# Patient Record
Sex: Female | Born: 1960 | Race: White | Hispanic: No | Marital: Married | Smoking: Never smoker
Health system: Southern US, Community
[De-identification: ages and names within clinical notes are randomized; demographics above are authoritative.]

## PROBLEM LIST (undated history)

## (undated) DIAGNOSIS — M1990 Unspecified osteoarthritis, unspecified site: Secondary | ICD-10-CM

## (undated) DIAGNOSIS — K5100 Ulcerative (chronic) pancolitis without complications: Secondary | ICD-10-CM

## (undated) DIAGNOSIS — Z86010 Personal history of colonic polyps: Secondary | ICD-10-CM

## (undated) DIAGNOSIS — E119 Type 2 diabetes mellitus without complications: Secondary | ICD-10-CM

## (undated) DIAGNOSIS — R945 Abnormal results of liver function studies: Secondary | ICD-10-CM

## (undated) DIAGNOSIS — K219 Gastro-esophageal reflux disease without esophagitis: Secondary | ICD-10-CM

## (undated) DIAGNOSIS — F988 Other specified behavioral and emotional disorders with onset usually occurring in childhood and adolescence: Secondary | ICD-10-CM

## (undated) DIAGNOSIS — T7840XA Allergy, unspecified, initial encounter: Secondary | ICD-10-CM

## (undated) DIAGNOSIS — M722 Plantar fascial fibromatosis: Secondary | ICD-10-CM

## (undated) DIAGNOSIS — I10 Essential (primary) hypertension: Secondary | ICD-10-CM

## (undated) DIAGNOSIS — R51 Headache: Secondary | ICD-10-CM

## (undated) DIAGNOSIS — R7989 Other specified abnormal findings of blood chemistry: Secondary | ICD-10-CM

## (undated) DIAGNOSIS — F32A Depression, unspecified: Secondary | ICD-10-CM

## (undated) DIAGNOSIS — E669 Obesity, unspecified: Secondary | ICD-10-CM

## (undated) DIAGNOSIS — E781 Pure hyperglyceridemia: Secondary | ICD-10-CM

## (undated) DIAGNOSIS — F419 Anxiety disorder, unspecified: Secondary | ICD-10-CM

## (undated) DIAGNOSIS — R7303 Prediabetes: Secondary | ICD-10-CM

## (undated) HISTORY — DX: Prediabetes: R73.03

## (undated) HISTORY — PX: POLYPECTOMY: SHX149

## (undated) HISTORY — DX: Gastro-esophageal reflux disease without esophagitis: K21.9

## (undated) HISTORY — DX: Other specified abnormal findings of blood chemistry: R79.89

## (undated) HISTORY — DX: Allergy, unspecified, initial encounter: T78.40XA

## (undated) HISTORY — DX: Depression, unspecified: F32.A

## (undated) HISTORY — DX: Personal history of colonic polyps: Z86.010

## (undated) HISTORY — DX: Essential (primary) hypertension: I10

## (undated) HISTORY — DX: Gilbert syndrome: E80.4

## (undated) HISTORY — DX: Obesity, unspecified: E66.9

## (undated) HISTORY — DX: Plantar fascial fibromatosis: M72.2

## (undated) HISTORY — DX: Ulcerative (chronic) pancolitis without complications: K51.00

## (undated) HISTORY — DX: Type 2 diabetes mellitus without complications: E11.9

## (undated) HISTORY — DX: Abnormal results of liver function studies: R94.5

## (undated) HISTORY — DX: Headache: R51

## (undated) HISTORY — DX: Anxiety disorder, unspecified: F41.9

## (undated) HISTORY — DX: Pure hyperglyceridemia: E78.1

## (undated) HISTORY — DX: Unspecified osteoarthritis, unspecified site: M19.90

---

## 1993-05-23 HISTORY — PX: GANGLION CYST EXCISION: SHX1691

## 1999-06-30 ENCOUNTER — Other Ambulatory Visit: Admission: RE | Admit: 1999-06-30 | Discharge: 1999-06-30 | Payer: Self-pay | Admitting: Obstetrics and Gynecology

## 2003-07-22 ENCOUNTER — Encounter: Payer: Self-pay | Admitting: Internal Medicine

## 2003-08-06 ENCOUNTER — Encounter: Payer: Self-pay | Admitting: Internal Medicine

## 2003-11-11 ENCOUNTER — Other Ambulatory Visit: Admission: RE | Admit: 2003-11-11 | Discharge: 2003-11-11 | Payer: Self-pay | Admitting: Internal Medicine

## 2003-11-11 ENCOUNTER — Encounter: Admission: RE | Admit: 2003-11-11 | Discharge: 2003-11-11 | Payer: Self-pay | Admitting: Internal Medicine

## 2004-11-12 ENCOUNTER — Encounter: Admission: RE | Admit: 2004-11-12 | Discharge: 2004-12-22 | Payer: Self-pay | Admitting: Internal Medicine

## 2005-04-20 ENCOUNTER — Ambulatory Visit: Payer: Self-pay | Admitting: Internal Medicine

## 2005-06-28 ENCOUNTER — Encounter: Admission: RE | Admit: 2005-06-28 | Discharge: 2005-06-28 | Payer: Self-pay | Admitting: Internal Medicine

## 2005-06-29 ENCOUNTER — Other Ambulatory Visit: Admission: RE | Admit: 2005-06-29 | Discharge: 2005-06-29 | Payer: Self-pay | Admitting: Internal Medicine

## 2005-08-18 ENCOUNTER — Ambulatory Visit: Payer: Self-pay | Admitting: Internal Medicine

## 2006-07-26 ENCOUNTER — Other Ambulatory Visit: Admission: RE | Admit: 2006-07-26 | Discharge: 2006-07-26 | Payer: Self-pay | Admitting: Family Medicine

## 2006-07-27 ENCOUNTER — Encounter: Admission: RE | Admit: 2006-07-27 | Discharge: 2006-07-27 | Payer: Self-pay | Admitting: *Deleted

## 2006-09-29 ENCOUNTER — Ambulatory Visit: Payer: Self-pay | Admitting: Internal Medicine

## 2007-08-10 ENCOUNTER — Other Ambulatory Visit: Admission: RE | Admit: 2007-08-10 | Discharge: 2007-08-10 | Payer: Self-pay | Admitting: Family Medicine

## 2007-08-10 ENCOUNTER — Encounter: Payer: Self-pay | Admitting: Internal Medicine

## 2007-08-10 ENCOUNTER — Encounter: Admission: RE | Admit: 2007-08-10 | Discharge: 2007-08-10 | Payer: Self-pay | Admitting: Family Medicine

## 2007-09-06 ENCOUNTER — Encounter: Payer: Self-pay | Admitting: Internal Medicine

## 2007-10-23 DIAGNOSIS — T7840XA Allergy, unspecified, initial encounter: Secondary | ICD-10-CM | POA: Insufficient documentation

## 2007-10-23 DIAGNOSIS — E66812 Obesity, class 2: Secondary | ICD-10-CM | POA: Insufficient documentation

## 2007-10-23 DIAGNOSIS — R51 Headache: Secondary | ICD-10-CM | POA: Insufficient documentation

## 2007-10-23 DIAGNOSIS — R519 Headache, unspecified: Secondary | ICD-10-CM | POA: Insufficient documentation

## 2007-10-23 DIAGNOSIS — I10 Essential (primary) hypertension: Secondary | ICD-10-CM | POA: Insufficient documentation

## 2007-10-23 DIAGNOSIS — E669 Obesity, unspecified: Secondary | ICD-10-CM

## 2007-10-24 ENCOUNTER — Ambulatory Visit: Payer: Self-pay | Admitting: Internal Medicine

## 2007-10-24 DIAGNOSIS — K5100 Ulcerative (chronic) pancolitis without complications: Secondary | ICD-10-CM | POA: Insufficient documentation

## 2008-04-03 ENCOUNTER — Telehealth: Payer: Self-pay | Admitting: Internal Medicine

## 2008-09-05 ENCOUNTER — Encounter: Admission: RE | Admit: 2008-09-05 | Discharge: 2008-09-05 | Payer: Self-pay | Admitting: Family Medicine

## 2008-09-05 ENCOUNTER — Encounter: Payer: Self-pay | Admitting: Internal Medicine

## 2008-09-05 ENCOUNTER — Other Ambulatory Visit: Admission: RE | Admit: 2008-09-05 | Discharge: 2008-09-05 | Payer: Self-pay | Admitting: Family Medicine

## 2008-10-09 ENCOUNTER — Telehealth: Payer: Self-pay | Admitting: Internal Medicine

## 2008-11-18 ENCOUNTER — Ambulatory Visit: Payer: Self-pay | Admitting: Internal Medicine

## 2008-11-19 LAB — CONVERTED CEMR LAB
ALT: 41 units/L — ABNORMAL HIGH (ref 0–35)
AST: 38 units/L — ABNORMAL HIGH (ref 0–37)
Albumin: 4 g/dL (ref 3.5–5.2)
Basophils Relative: 0.4 % (ref 0.0–3.0)
Eosinophils Absolute: 0.2 10*3/uL (ref 0.0–0.7)
HCT: 40.3 % (ref 36.0–46.0)
Hemoglobin: 14.4 g/dL (ref 12.0–15.0)
Leukocytes, UA: NEGATIVE
MCV: 91.9 fL (ref 78.0–100.0)
Monocytes Absolute: 0.6 10*3/uL (ref 0.1–1.0)
Monocytes Relative: 7.9 % (ref 3.0–12.0)
Neutrophils Relative %: 59.4 % (ref 43.0–77.0)
Platelets: 267 10*3/uL (ref 150.0–400.0)
RDW: 12.1 % (ref 11.5–14.6)
Specific Gravity, Urine: >=1.03 (ref 1.000–1.030)
WBC: 8.2 10*3/uL (ref 4.5–10.5)

## 2008-12-04 ENCOUNTER — Telehealth: Payer: Self-pay | Admitting: Internal Medicine

## 2009-01-02 ENCOUNTER — Telehealth: Payer: Self-pay | Admitting: Internal Medicine

## 2009-01-08 ENCOUNTER — Encounter: Payer: Self-pay | Admitting: Internal Medicine

## 2009-01-08 ENCOUNTER — Ambulatory Visit: Payer: Self-pay | Admitting: Internal Medicine

## 2009-01-08 HISTORY — PX: COLONOSCOPY: SHX174

## 2009-01-12 ENCOUNTER — Encounter: Payer: Self-pay | Admitting: Internal Medicine

## 2009-01-19 ENCOUNTER — Telehealth: Payer: Self-pay | Admitting: Internal Medicine

## 2009-05-01 ENCOUNTER — Telehealth: Payer: Self-pay | Admitting: Internal Medicine

## 2009-09-07 ENCOUNTER — Encounter: Admission: RE | Admit: 2009-09-07 | Discharge: 2009-09-07 | Payer: Self-pay | Admitting: Family Medicine

## 2009-10-15 ENCOUNTER — Telehealth: Payer: Self-pay | Admitting: Internal Medicine

## 2010-01-07 ENCOUNTER — Telehealth: Payer: Self-pay | Admitting: Internal Medicine

## 2010-01-14 ENCOUNTER — Ambulatory Visit: Payer: Self-pay | Admitting: Internal Medicine

## 2010-05-23 HISTORY — PX: ABLATION SAPHENOUS VEIN W/ RFA: SUR11

## 2010-06-22 NOTE — Progress Notes (Signed)
Summary: Asacol refills  Phone Note Call from Patient Call back at [facility] Phone [phone]   Call For: Dr [name] Summary of Call: Will she need a follow up appoinment before more refills for Asacol are refilled. Initial call taken by: Irwin Brakeman [facility],  Oct 15, 2009 1:26 PM  Follow-up for Phone Call        I spoke to pt and she is doing well on Asacol.  I advised her she will have to be seen for follow up in August, but I will send refill through that time. Attempted to schedule appt after 2nd week in  August, but schedule is not in that far.  Flag sent to myself to call pt to schedule.  Refil sent to [facility]. Follow-up by: Abelino Derrick CMA Deborra Medina),  Oct 16, 2009 9:42 AM    Prescriptions: ASACOL 400 MG  TBEC (MESALAMINE) 6 by mouth two times a day  #1080 x 0   Entered by:   Abelino Derrick CMA ([place])   Authorized by:   Gatha Mayer MD, [facility]   Signed by:   Abelino Derrick CMA ([place]) on 10/16/2009   Method used:   Electronically to        [facility] (mail-order)             ,          Ph: [phone]       Fax: [phone]   RxID:   [id number]

## 2010-06-22 NOTE — Assessment & Plan Note (Signed)
Summary: FOLLOW UP ULCERATIVE COLITIS/SP   History of Present Illness Visit Type: Follow-up Visit Primary GI MD: Stan Head MD [facility] Primary Provider: Bill Salinas, NP Chief Complaint: ulcerative colitis History of Present Illness:   50 yo ww with chronic universal UC. She is well without signs or symptoms of problems. On chronic asacol. Has lost weight on phentermine and diet, coordinated by PCP.    GI Review of Systems      Denies abdominal pain, acid reflux, belching, bloating, chest pain, dysphagia with liquids, dysphagia with solids, heartburn, loss of appetite, nausea, vomiting, vomiting blood, weight loss, and  weight gain.        Denies anal fissure, black tarry stools, change in bowel habit, constipation, diarrhea, diverticulosis, fecal incontinence, heme positive stool, hemorrhoids, irritable bowel syndrome, jaundice, light color stool, liver problems, rectal bleeding, and  rectal pain.    Clinical Reports Reviewed:  Colonoscopy:  01/08/2009:  1) Normal colonoscopy, excellent prep 2) No evidence of active ulcerative colitis on 4.8 g Asacol/day. Random biosies taken to assess microscopic level. BXS: BENIGN MUCOSA  01/08/2009:  Results: Normal. ENDOSCOPIC IMPRESSION:   1) Normal colonoscopy, excellent prep   2) No evidence of active ulcerative colitis on 4.8 g Asacol/day. Random biosies taken to assess microscopic level.  ***MICROSCOPIC EXAMINATION AND DIAGNOSIS***   COLON, RANDOM BIOPSIES:  BENIGN COLONIC MUCOSA.  NEGATIVE FOR DYSPLASIA.  NO ACTIVE INFLAMMATION, GRANULOMAS OR MICROSCOPIC COLITIS.  07/22/2003:  Location:  [facility].    Current Medications (verified): 1)  Zyrtec Allergy 10 Mg  Tabs (Cetirizine Hcl) .... Take 1 Tablet By Mouth Once A Day 2)  Hydrochlorothiazide 25 Mg  Tabs (Hydrochlorothiazide) .... Take 1 Tablet By Mouth Once A Day 3)  Asacol 400 Mg  Tbec (Mesalamine) .... 6 By Mouth Two Times A Day 4)  Metformin Hcl 500 Mg  Tabs (Metformin Hcl) .... Once Daily 5)  Carisoprodol 350 Mg Tabs (Carisoprodol) .... Take As Directed 6)  Fish Oil 1000 Mg Caps (Omega-3 Fatty Acids) .... Take 3 Capsules Once Daily 7)  Calcium-Vitamin D 250-125 Mg-Unit Tabs (Calcium Carbonate-Vitamin D) .... Take 3 Tablets By Mouth Every Evening 8)  Phentermine Hcl 37.5 Mg Tabs (Phentermine Hcl) .... 1/2 Tab By Mouth Once Daily  Allergies (verified): 1)  ! Codeine  Past History:  Past Medical History: Reviewed history from 10/24/2007 and no changes required. Universal Ulcerative Colitis (dx 2005) Pre-diabetic Obesity Hypertenson Hyperlipidemia (TG's) Headaches Allergies Planta Fasciitis  Past Surgical History: Reviewed history from 10/24/2007 and no changes required. Ganglion Cyst removal- left hand  Family History: Reviewed history from 11/18/2008 and no changes required. Family History of Prostate Cancer: Father Family History of Clotting disorder: Father Family History of Diabetes:Maternal Grandmother, mother No FH of Colon Cancer:  Social History: Occupation: Childcare - kitchen work Patient has never smoked.  Alcohol Use - yes-on occasion Illicit Drug Use - no Patient does not get regular exercise.  Married, no children  Vital Signs:  Patient profile:   50 year old female Height:      67 inches Weight:      226.13 pounds BMI:     35.54 Pulse rate:   88 / minute Pulse rhythm:   regular BP sitting:   104 / 70  (left arm) Cuff size:   large  Vitals Entered By: June McMurray CMA Duncan Dull) (January 14, 2010 4:14 PM)  Physical Exam  General:  obese.  NAD Lungs:  Clear throughout to auscultation. Heart:  Regular rate and rhythm;  no murmurs, rubs,  or bruits. Abdomen:  obese and non-teder, no HSM or masses   Impression & Recommendations:  Problem # 1:  ULCERATIVE COLITIS, UNIVERSAL (ICD-556.6) Assessment Unchanged Dx 2005 colonoscopy In remission 2010 colonoscopy and clinically contnue Tx with Asacol 4.8  g daily and annual routine follow-up Next routine colonoscopy 2015 f/u on labs from pcp (4/11)  Problem # 2:  TRANSAMINASES, SERUM, ELEVATED (ICD-790.4) Assessment: Unchanged will see what labs showed  Patient Instructions: 1)  Please continue current medications.  2)  Please schedule a follow-up appointment in 1 year. 3)  Copy sent to : Bill Salinas, NP 4)  The medication list was reviewed and reconciled.  All changed / newly prescribed medications were explained.  A complete medication list was provided to the patient / caregiver. Prescriptions: ASACOL 400 MG  TBEC (MESALAMINE) 6 by mouth two times a day  #1080 x 3   Entered and Authorized by:   Iva Boop MD, [facility]   Signed by:   Iva Boop MD, FACG on 01/14/2010   Method used:   Faxed to ...       [facility] (mail-order)             , [place]         Ph: [phone]       Fax: [phone]   RxID:   [id number]

## 2010-06-22 NOTE — Progress Notes (Signed)
Summary: Triage  Phone Note Call from Patient Call back at Home Phone [phone]   Call For: Dr Leone Payor Reason for Call: Talk to Nurse Summary of Call: What headache medicine can she take that would be the least harmful for her colitis. Initial call taken by: Leanor Kail [facility],  January 07, 2010 8:28 AM  Follow-up for Phone Call        Per Dr.Patterson--Tylenol products are O.K., try to avoid NSAIDS/Ibuprofen. Message left for pt. Pt. instructed to call back as needed.  Follow-up by: Laureen Ochs LPN,  January 07, 2010 8:48 AM

## 2010-07-23 ENCOUNTER — Other Ambulatory Visit: Payer: Self-pay | Admitting: *Deleted

## 2010-07-23 DIAGNOSIS — Z1231 Encounter for screening mammogram for malignant neoplasm of breast: Secondary | ICD-10-CM

## 2010-07-26 ENCOUNTER — Encounter: Payer: Self-pay | Admitting: Internal Medicine

## 2010-07-26 ENCOUNTER — Telehealth: Payer: Self-pay | Admitting: Internal Medicine

## 2010-08-03 NOTE — Progress Notes (Signed)
Summary: Questions  Phone Note Call from Patient Call back at Home Phone [phone]   Caller: Patient Call For: Dr. Leone Payor Reason for Call: Talk to Nurse Summary of Call: Patient needs to speak with a nurse concerning medications Initial call taken by: [place] Johnson,  July 26, 2010 9:48 AM  Follow-up for Phone Call        Patient is having trouble with ? sciatica she is asking what NSAID is ok for her take with her UC? Follow-up by: Darcey Nora RN, CGRN,  July 26, 2010 2:38 PM  Additional Follow-up for Phone Call Additional follow up Details #1::        Celebrex is the one recommended to have the least chance of precipitating a flare Additional Follow-up by: Iva Boop MD, [facility] Graham,  July 26, 2010 3:12 PM    Additional Follow-up for Phone Call Additional follow up Details #2::    patient advised Follow-up by: Darcey Nora RN, CGRN,  July 26, 2010 3:30 PM

## 2010-09-14 ENCOUNTER — Ambulatory Visit
Admission: RE | Admit: 2010-09-14 | Discharge: 2010-09-14 | Disposition: A | Discharge status: Home / Self Care | Payer: 59 | Source: Ambulatory Visit | Attending: *Deleted | Admitting: *Deleted

## 2010-09-14 ENCOUNTER — Telehealth: Payer: Self-pay | Admitting: Internal Medicine

## 2010-09-14 DIAGNOSIS — Z1231 Encounter for screening mammogram for malignant neoplasm of breast: Secondary | ICD-10-CM

## 2010-09-14 NOTE — Telephone Encounter (Signed)
Patient's primary care Beverly Sandoval is suggesting she take for her knee pain 1 Celebrex during the day and 2 Tylenol Arthritis at night for the next 3 months instead of BID celebrex.  Dr [name] is this ok?

## 2010-09-14 NOTE — Telephone Encounter (Signed)
This is acceptable

## 2010-09-14 NOTE — Telephone Encounter (Signed)
Left message for patient to call back  

## 2010-09-14 NOTE — Telephone Encounter (Signed)
Patient advised.

## 2010-09-21 ENCOUNTER — Telehealth: Payer: Self-pay | Admitting: Internal Medicine

## 2010-09-21 NOTE — Telephone Encounter (Signed)
She had labs drawn for her annual physical with her primary care MD.  They told her her bili was elevated at 1.2.  I assured her that this was ok, but asked to have a copy faxed to [place].  She reports the only other abnormality was her triglycerides  And they started her on pravachol today. She reports all other hepatic levels were normal.  She will have labs faxed

## 2010-09-22 NOTE — Telephone Encounter (Signed)
Ok Does not sound like a problem

## 2010-10-04 ENCOUNTER — Encounter: Payer: Self-pay | Admitting: Internal Medicine

## 2010-10-05 NOTE — Assessment & Plan Note (Signed)
[facility] OFFICE NOTE   HANAA, PAYES                      MRN:          [id number]  DATE:09/29/2006                            DOB:          04/09/1961    CHIEF COMPLAINT:  Follow up of ulcerative colitis and medication refill.   PROBLEMS:  1. 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  2. Hypertension, new diagnosis.  3. Obesity.  4. Allergies.  5. Headaches.   MEDICATIONS:  1. Asacol 6 tablets twice daily.  2. Hydrochlorothiazide 1 each daily, dosage unknown.  3. Omega vitamin daily.  4. Zyrtec D every 12 hours p.r.n.  5. Naprosyn p.r.n.   ALLERGIES:  CODEINE has caused flu like symptoms.   INTERVAL HISTORY:  Ms. Rengel has done well. There is no bleeding and  no significant diarrhea on her Asacol at this dose. She would like a  refill. She does have the new diagnosis of hypertension.   PHYSICAL EXAMINATION:  GENERAL:  Shows an obese middle aged white woman.  VITAL SIGNS:  Weight 254 pounds,  pulse 108, blood pressure 176/74.  EYES:  Anicteric.  ABDOMEN:  Soft and nontender. No organomegaly or mass.   ASSESSMENT:  Ulcerative colitis, doing well on 4.8 grams total of  Asacol. We will continue that. She will see me in a year, sooner if  there is a flare. She has a new primary care physician, Dr. Evelena Leyden.  She needs further follow up of her blood pressure.   Note, the patient had labs within the last couple of months with her  physical and tells me that everything was normal. She should continue  getting yearly labs with her primary care physician.     Gatha Mayer, MD,FACG  Electronically Signed   CEG/[facility]  DD: 09/29/2006  DT: 09/30/2006  Job #: [id number]   cc:   Theodoro Kos, DO

## 2011-01-03 ENCOUNTER — Other Ambulatory Visit: Payer: Self-pay | Admitting: Internal Medicine

## 2011-01-03 MED ORDER — MESALAMINE 400 MG PO TBEC: DELAYED_RELEASE_TABLET | ORAL | Status: DC

## 2011-01-03 NOTE — Telephone Encounter (Signed)
Medication refilled

## 2011-02-21 ENCOUNTER — Encounter: Payer: Self-pay | Admitting: Internal Medicine

## 2011-02-21 ENCOUNTER — Ambulatory Visit (INDEPENDENT_AMBULATORY_CARE_PROVIDER_SITE_OTHER): Payer: 59 | Admitting: Internal Medicine

## 2011-02-21 VITALS — BP 118/82 | HR 82 | Ht 67.0 in | Wt 242.0 lb

## 2011-02-21 DIAGNOSIS — E669 Obesity, unspecified: Secondary | ICD-10-CM

## 2011-02-21 DIAGNOSIS — K5100 Ulcerative (chronic) pancolitis without complications: Secondary | ICD-10-CM

## 2011-02-21 HISTORY — DX: Gilbert syndrome: E80.4

## 2011-02-21 MED ORDER — MESALAMINE 400 MG PO TBEC: DELAYED_RELEASE_TABLET | ORAL | Status: DC

## 2011-02-21 NOTE — Assessment & Plan Note (Addendum)
Have discussed bariatric surgery given comobidities (htn, DM) with BMI 37+- will give her the # to the program to register for a seminar

## 2011-02-21 NOTE — Patient Instructions (Signed)
Your prescription(s) has(have) been sent to your pharmacy [facility]. The number to the Bariatric Center is [phone]. Please have your primary care physician  Fax your office notes and labs to our office.

## 2011-02-21 NOTE — Assessment & Plan Note (Addendum)
No signs or symptoms of active disease Refill Asacol at 4.8 g/day and see me in 1 year - approximately Routine colonoscopy 2015

## 2011-02-22 ENCOUNTER — Encounter: Payer: Self-pay | Admitting: Internal Medicine

## 2011-02-22 ENCOUNTER — Telehealth: Payer: Self-pay | Admitting: Gastroenterology

## 2011-02-22 NOTE — Progress Notes (Signed)
  Subjective:    Patient ID: Beverly Sandoval, female    DOB: 1961/03/21, 50 y.o.   MRN: [id number]  HPI 50 yo ww with universal ulcerative colitis. She is not having any diarrhea, rectal bleeding, blood in stool or abdominal pain on mesalamine 4.8 g/daily. 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 Weight is unchanged overall. 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  Past Medical History  Diagnosis Date  . Universal ulcerative colitis   . Pre-diabetes   . HTN (hypertension)   . Obesity   . Hypertriglyceridemia   . Headache   . Allergic rhinitis   . Plantar fasciitis   . Abnormal LFTs    Past Surgical History  Procedure Date  . Ganglion cyst excision     left hand  . Colonoscopy 01/08/2009    Normal, ulcerative colitis in remission  . Vein ablasion surgery     reports that she has never smoked. She has never used smokeless tobacco. She reports that she drinks alcohol. She reports that she does not use illicit drugs. family history includes Clotting disorder in her father; Diabetes in her mother; and Prostate cancer in her father. Allergies  Allergen Reactions  . Codeine     REACTION: Flu like symptoms        Review of Systems As above    Objective:   Physical Exam Obese ww, NAD Lungs clear'\heart sounds normal Abdomen is obese and nontender       Assessment & Plan:

## 2011-02-22 NOTE — Telephone Encounter (Signed)
Message copied by Bernita Buffy on Tue Feb 22, 2011 11:03 AM ------      Message from: Iva Boop      Created: Tue Feb 22, 2011  6:54 AM      Regarding: needs note copied       Please cc her note to Bill Salinas (NP) at her primary care practice - that name is not loaded into [employer]

## 2011-02-22 NOTE — Telephone Encounter (Signed)
Office visit notes faxed to Dr. Bill Salinas at [facility].

## 2011-07-15 ENCOUNTER — Other Ambulatory Visit: Payer: Self-pay | Admitting: Nurse Practitioner

## 2011-07-15 DIAGNOSIS — Z1231 Encounter for screening mammogram for malignant neoplasm of breast: Secondary | ICD-10-CM

## 2011-08-31 ENCOUNTER — Telehealth: Payer: Self-pay | Admitting: Internal Medicine

## 2011-09-01 ENCOUNTER — Telehealth: Payer: Self-pay | Admitting: Internal Medicine

## 2011-09-01 MED ORDER — MESALAMINE 1.2 G PO TBEC: DELAYED_RELEASE_TABLET | ORAL | Status: DC

## 2011-09-01 NOTE — Telephone Encounter (Signed)
Lialda 1.2 g  Take 2 each day # 180 with 3 refills

## 2011-09-01 NOTE — Telephone Encounter (Signed)
She just called to make sure that Dr. [name] thought this would work for her.  I told her to give it a try and just let us know if she has any problems.  She was comfortable with this information.

## 2011-09-01 NOTE — Telephone Encounter (Signed)
No discount cards for the Delzicol.

## 2011-09-01 NOTE — Telephone Encounter (Signed)
Are there discount cards for Delzicol?

## 2011-09-01 NOTE — Telephone Encounter (Signed)
Left message for pt informing her that Dr. [name] wants her to try Lialda 1.2g. Take 2 each day.  This has been sent to her [place]. I told her to call back with any ?'s and left our number.

## 2011-09-21 ENCOUNTER — Ambulatory Visit
Admission: RE | Admit: 2011-09-21 | Discharge: 2011-09-21 | Disposition: A | Discharge status: Home / Self Care | Payer: 59 | Source: Ambulatory Visit | Attending: Nurse Practitioner | Admitting: Nurse Practitioner

## 2011-09-21 DIAGNOSIS — Z1231 Encounter for screening mammogram for malignant neoplasm of breast: Secondary | ICD-10-CM

## 2011-09-22 ENCOUNTER — Telehealth: Payer: Self-pay | Admitting: Internal Medicine

## 2011-09-22 NOTE — Telephone Encounter (Signed)
Will await fax.

## 2011-09-23 ENCOUNTER — Telehealth: Payer: Self-pay | Admitting: Internal Medicine

## 2011-09-23 NOTE — Telephone Encounter (Signed)
Spoke with patient and she needs our fax number so she can fax results. Discussed with patient the normal range of total bil.

## 2011-09-27 ENCOUNTER — Telehealth: Payer: Self-pay | Admitting: Internal Medicine

## 2011-09-27 NOTE — Telephone Encounter (Signed)
Patient advised.  All questions answered

## 2011-09-27 NOTE — Telephone Encounter (Signed)
I have seen the metabolic panel, CBC and lipid results from May 1. There are all normal except for her lipids with triglycerides and omega 3 fatty acids supplementation is fine.

## 2011-09-27 NOTE — Telephone Encounter (Signed)
Dr [name] can you please review records in your folder and advise.

## 2011-10-19 ENCOUNTER — Telehealth: Payer: Self-pay | Admitting: Internal Medicine

## 2011-10-19 NOTE — Telephone Encounter (Signed)
Pt informed that safe to take the Asacol she has from the mail order place.  She has a month or so left.  She thought it might have been discontinued for safety reasons.  I assured her it was not.

## 2011-12-06 ENCOUNTER — Telehealth: Payer: Self-pay

## 2011-12-06 ENCOUNTER — Telehealth: Payer: Self-pay | Admitting: Internal Medicine

## 2011-12-06 NOTE — Telephone Encounter (Signed)
Per Dr. Leone Payor pt may use the Lialda she has on hand in place of the Asacol that insurance didn't want to cover.  Now new insurance company that would cover.  She will let us know how she does on this medicine.

## 2011-12-06 NOTE — Telephone Encounter (Signed)
Please advise which medicine you would like her to take, thank you.

## 2011-12-06 NOTE — Telephone Encounter (Signed)
I think it is ok to use the Lialda she has and we can then switch back to Asacol - she should do ok with either.  If she really wants to spend the $ and stay on Asacol we can do it

## 2012-02-17 ENCOUNTER — Telehealth: Payer: Self-pay | Admitting: Internal Medicine

## 2012-02-17 NOTE — Telephone Encounter (Signed)
She should increase Lialda to 4 tablets daily - probably 2 bid  May need samples or another script before she comes 11/4 unless she has enough on hand and then can rewrite then

## 2012-02-17 NOTE — Telephone Encounter (Signed)
Patient reports semi formed stool every other day.  Never really has a formed stool.  She is due for annual follow up.  I have scheduled her an appt for 03/26/12.  Dr. [name] Do you have any recommendations until REV?

## 2012-02-20 MED ORDER — MESALAMINE 1.2 G PO TBEC: 2.4000 g | DELAYED_RELEASE_TABLET | Freq: Two times a day (BID) | ORAL | Status: DC

## 2012-02-20 NOTE — Telephone Encounter (Signed)
I have left a voicemail for the patient with Dr. Marvell Fuller recommendations.  She is asked to call if she needs samples or has any questions.  She is also asked to make sure she keeps the appt for 03/26/12

## 2012-02-21 NOTE — Progress Notes (Signed)
Patient ID: Beverly Sandoval, female   DOB: 07/06/1960, 51 y.o.   MRN: [id number] Faxed to [facility] at [phone] rx for Mesalamine (Lialda) 1.2 G EC tablet ,  Take 2 tablets po bid.  # 360, 3 RF's, icd9: 556.6. Duayne Cal sent this Rx in to [facility] 02/20/12 and today got the [employer] form. Marland Kitchen.[facility] no longer covering pt meds.

## 2012-03-16 ENCOUNTER — Telehealth: Payer: Self-pay | Admitting: Internal Medicine

## 2012-03-16 NOTE — Telephone Encounter (Signed)
Patient advised that ok to take Tylenol, avoid NSAIDS .  She will call back for any additional questions

## 2012-03-23 ENCOUNTER — Ambulatory Visit: Payer: 59 | Admitting: Internal Medicine

## 2012-04-13 ENCOUNTER — Ambulatory Visit: Payer: 59 | Admitting: Internal Medicine

## 2012-05-02 ENCOUNTER — Encounter: Payer: Self-pay | Admitting: *Deleted

## 2012-05-07 ENCOUNTER — Telehealth: Payer: Self-pay

## 2012-05-07 ENCOUNTER — Ambulatory Visit (INDEPENDENT_AMBULATORY_CARE_PROVIDER_SITE_OTHER): Payer: 59 | Admitting: Internal Medicine

## 2012-05-07 ENCOUNTER — Encounter: Payer: Self-pay | Admitting: Internal Medicine

## 2012-05-07 VITALS — BP 120/80 | HR 72 | Ht 67.0 in | Wt 241.6 lb

## 2012-05-07 DIAGNOSIS — K5100 Ulcerative (chronic) pancolitis without complications: Secondary | ICD-10-CM

## 2012-05-07 DIAGNOSIS — IMO0001 Reserved for inherently not codable concepts without codable children: Secondary | ICD-10-CM

## 2012-05-07 NOTE — Progress Notes (Signed)
  Subjective:    Patient ID: Beverly Sandoval, female    DOB: 05/17/1961, 51 y.o.   MRN: [id number]  HPI Doing well but started cholestyramine for increased TG's. She plans to lose weight starting the first of 2014. Weight has been stable. No pain or diarrhea. Lialda was 2.4 g and was chnaged to 4.8 g to match Asacol as before this Fall, when she called in with loose stools.  Allergies  Allergen Reactions  . Codeine     REACTION: Flu like symptoms   Outpatient Prescriptions Prior to Visit  Medication Sig Dispense Refill  . cetirizine (ZYRTEC) 10 MG tablet Take 10 mg by mouth daily.        . hydrochlorothiazide 25 MG tablet Take 25 mg by mouth daily.        . mesalamine (LIALDA) 1.2 G EC tablet Take 2 tablets (2.4 g total) by mouth 2 (two) times daily.  360 tablet  3  . metFORMIN (GLUMETZA) 500 MG (MOD) 24 hr tablet Take 500 mg by mouth daily.        . [DISCONTINUED] fish oil-omega-3 fatty acids 1000 MG capsule Take 3 g by mouth daily.         Last reviewed on 05/07/2012  9:27 AM by Gatha Mayer, MD Past Medical History  Diagnosis Date  . Universal ulcerative colitis   . Pre-diabetes   . HTN (hypertension)   . Obesity   . Hypertriglyceridemia   . Headache   . Allergic rhinitis   . Plantar fasciitis   . Abnormal LFTs    Past Surgical History  Procedure Date  . Ganglion cyst excision     left hand  . Colonoscopy 01/08/2009    Normal, ulcerative colitis in remission(multiple)  . Vein ablasion surgery     Review of Systems As above    Objective:   Physical Exam General:  NAD Eyes:   anicteric Lungs:  clear Heart:  S1S2 no rubs, murmurs or gallops Abdomen:  soft and nontender, BS+ Ext:   no edema    Data Reviewed:  Normal CMET 03/26/2012 TG 296 same date Hgb A1C 5.8%    Assessment & Plan:   1. ULCERATIVE COLITIS, UNIVERSAL   2. Obesity, Class II, BMI 35-39.9, with comorbidity    1. Continue Lialda at present dose 2. Weight loss as she plans - we also  discussed possible bariatric surgery.   Cc: Linus Mako, NP

## 2012-05-07 NOTE — Telephone Encounter (Signed)
Bill Salinas NP no longer at Dr. Adriana Simas office in [facility] [place], pt seen there in Nov. 2013 so faxed today's office note there for their records.

## 2012-05-07 NOTE — Patient Instructions (Addendum)
Good luck with your weight loss plans for 2014 - I hope you are successful. It is good that you have at least been stable!  Thank you for choosing me and [facility].  Iva Boop, M.D., [facility]

## 2012-08-15 ENCOUNTER — Telehealth: Payer: Self-pay | Admitting: Internal Medicine

## 2012-08-15 NOTE — Telephone Encounter (Signed)
I looked at the ingredients list and though will not say 100% it looks ok  I hope it helps her but I would tell her not good science behind what they claim, etc

## 2012-08-15 NOTE — Telephone Encounter (Signed)
Patient is doing a new diet( Plexuflim). Her next step is to take the pill Biocleanse. She states it is a natural product. She is asking if Dr Leone Payor thinks this would be ok for her to take.Please, advise.

## 2012-08-16 NOTE — Telephone Encounter (Signed)
Spoke with patient and gave her Dr. Marvell Fuller recommendation.

## 2012-09-28 ENCOUNTER — Telehealth: Payer: Self-pay | Admitting: Internal Medicine

## 2012-09-28 NOTE — Telephone Encounter (Signed)
She has had labs with her primary care and she will send [place] a copy

## 2012-10-01 ENCOUNTER — Other Ambulatory Visit: Payer: Self-pay

## 2012-10-01 DIAGNOSIS — Z1231 Encounter for screening mammogram for malignant neoplasm of breast: Secondary | ICD-10-CM

## 2012-10-03 ENCOUNTER — Encounter: Payer: Self-pay | Admitting: Internal Medicine

## 2012-10-25 ENCOUNTER — Ambulatory Visit
Admission: RE | Admit: 2012-10-25 | Discharge: 2012-10-25 | Disposition: A | Discharge status: Home / Self Care | Payer: 59 | Source: Ambulatory Visit

## 2012-10-25 ENCOUNTER — Ambulatory Visit: Payer: 59

## 2012-10-25 DIAGNOSIS — Z1231 Encounter for screening mammogram for malignant neoplasm of breast: Secondary | ICD-10-CM

## 2012-11-12 ENCOUNTER — Telehealth: Payer: Self-pay | Admitting: Internal Medicine

## 2012-11-12 MED ORDER — MESALAMINE 1.2 G PO TBEC: 2.4000 g | DELAYED_RELEASE_TABLET | Freq: Two times a day (BID) | ORAL | Status: DC

## 2012-11-12 NOTE — Telephone Encounter (Signed)
Patient notified that lialda sent in as requested, she will let us know if any problems with her coupon card.

## 2012-11-29 ENCOUNTER — Telehealth: Payer: Self-pay | Admitting: Internal Medicine

## 2012-11-29 NOTE — Telephone Encounter (Signed)
Patient would like you to review the fax that she sent, regarding her Probiotic.  She states if contains an antifungal and wants to know if any of the ingredients will damage the liver.

## 2012-11-30 NOTE — Telephone Encounter (Signed)
Patient advised.

## 2012-11-30 NOTE — Telephone Encounter (Signed)
I think the probiotic is ok to take

## 2013-01-28 ENCOUNTER — Telehealth: Payer: Self-pay | Admitting: Internal Medicine

## 2013-01-28 ENCOUNTER — Other Ambulatory Visit: Payer: Self-pay | Admitting: Internal Medicine

## 2013-01-28 NOTE — Telephone Encounter (Signed)
Pt states [facility] got a denial, we have a confirmation that the rx was sent I will call and check on this

## 2013-01-28 NOTE — Telephone Encounter (Signed)
[phone]

## 2013-01-28 NOTE — Telephone Encounter (Signed)
I spoke with Trey Paula at [facility] and he will send the medication out today pt notified

## 2013-04-15 ENCOUNTER — Telehealth: Payer: Self-pay | Admitting: Internal Medicine

## 2013-04-15 MED ORDER — MESALAMINE 1.2 G PO TBEC: 2.4000 g | DELAYED_RELEASE_TABLET | Freq: Two times a day (BID) | ORAL | Status: DC

## 2013-04-15 NOTE — Telephone Encounter (Signed)
Patient is scheduled for follow up in January and a refill on her Lilda to [facility] for 90 day supply

## 2013-04-15 NOTE — Telephone Encounter (Signed)
Left message for patient to call back  

## 2013-05-21 ENCOUNTER — Telehealth: Payer: Self-pay | Admitting: Internal Medicine

## 2013-05-21 NOTE — Telephone Encounter (Signed)
Patient was prescribed 6 day taper of prednisone for bronchitis.  She was making sure it is ok to take with her UC.  She is advised ok to take and let us know if she has any Gi concerns.  She will call back for any further questions or concerns

## 2013-06-18 ENCOUNTER — Encounter: Payer: Self-pay | Admitting: Internal Medicine

## 2013-06-18 ENCOUNTER — Ambulatory Visit (INDEPENDENT_AMBULATORY_CARE_PROVIDER_SITE_OTHER): Payer: 59 | Admitting: Internal Medicine

## 2013-06-18 VITALS — BP 124/86 | HR 72 | Ht 67.0 in | Wt 242.2 lb

## 2013-06-18 DIAGNOSIS — K5100 Ulcerative (chronic) pancolitis without complications: Secondary | ICD-10-CM

## 2013-06-18 MED ORDER — MESALAMINE 1.2 G PO TBEC: 2.4000 g | DELAYED_RELEASE_TABLET | Freq: Two times a day (BID) | ORAL | Status: DC

## 2013-06-18 NOTE — Assessment & Plan Note (Signed)
He is asymptomatic in clinical remission on Lialda 2.4 g daily. CBC and comprehensive metabolic panel were normal in May 2014. She is due for a repeat PCP physical 2015 at that time. She will be due for a colonoscopy in August of this year.

## 2013-06-18 NOTE — Progress Notes (Signed)
    Subjective:    Patient ID: Beverly Sandoval, female    DOB: [birth year], 53 y.o.   MRN: [id number]  HPI The patient is here to followup her ulcerative colitis. She is doing well without complaints. She was last seen in December 2013. She is maintained on Lialda 2.4 g daily. Allergies  Allergen Reactions  . Codeine     REACTION: Flu like symptoms   Outpatient Prescriptions Prior to Visit  Medication Sig Dispense Refill  . cetirizine (ZYRTEC) 10 MG tablet Take 10 mg by mouth daily.        . hydrochlorothiazide 25 MG tablet Take 25 mg by mouth daily.        . NON FORMULARY Take 1 capsule by mouth daily. Omega red/ fish oil      . mesalamine (LIALDA) 1.2 G EC tablet Take 2 tablets (2.4 g total) by mouth 2 (two) times daily.  360 tablet  1  . cholestyramine (QUESTRAN) 4 G packet Take 1 packet by mouth 3 (three) times daily with meals.      . metFORMIN (GLUMETZA) 500 MG (MOD) 24 hr tablet Take 500 mg by mouth daily.         No facility-administered medications prior to visit.   Past Medical History  Diagnosis Date  . Universal ulcerative colitis   . Pre-diabetes   . HTN (hypertension)   . Obesity   . Hypertriglyceridemia   . Headache(784.0)   . Allergic rhinitis   . Plantar fasciitis   . Abnormal LFTs    Past Surgical History  Procedure Laterality Date  . Ganglion cyst excision      left hand  . Colonoscopy  01/08/2009    Normal, ulcerative colitis in remission(multiple)  . Vein ablasion surgery                                Review of Systems She did have bronchitis in the last few months.    Objective:   Physical Exam General:  NAD obese white woman Abdomen:  soft and nontender, BS+    Assessment & Plan:

## 2013-06-18 NOTE — Patient Instructions (Signed)
We have sent the following medications to your mail order pharmacy: Domenic Moras you are doing great.  You will hear from [place] in August about setting up your colonoscopy.  I appreciate the opportunity to care for you.

## 2013-10-30 ENCOUNTER — Telehealth: Payer: Self-pay | Admitting: Internal Medicine

## 2013-10-30 NOTE — Telephone Encounter (Signed)
Patient reports that she was told by Dr. Luther Parody office that her liver tests "were barely slightly elevated" patient would like you to review.  Have you see this?  There are no records here that I am aware of.

## 2013-10-31 NOTE — Telephone Encounter (Signed)
She has had abnl LFT's in past and she has obesity/fatty liver  I have not seen the copies of labs though

## 2013-11-01 NOTE — Telephone Encounter (Signed)
Patient notified She will contact their office and ask that they fax again.

## 2013-11-04 ENCOUNTER — Telehealth: Payer: Self-pay | Admitting: Internal Medicine

## 2013-11-04 NOTE — Telephone Encounter (Signed)
Patient notified ok to take prednisone prescribed by her primary for 6 days.

## 2013-11-08 ENCOUNTER — Telehealth: Payer: Self-pay

## 2013-11-08 NOTE — Telephone Encounter (Signed)
Message copied by Marlon Pel on Fri Nov 08, 2013  9:14 AM ------      Message from: Silvano Rusk E      Created: Thu Nov 07, 2013  9:07 PM      Regarding: LFT's       Let her know that I have seen labs and she has mild increase in LFT's - do not think this is a bad problem      Likely represents fatty liver as before and not from [place]            She should have these rechecked (LFT's) in 6 weeks            She should try to reduce weight and triglycerides             I can call with LFT results or she can schedule a f/u      I suspect former is fine and we can wait on the labs before scheduling a visit       ------

## 2013-11-08 NOTE — Telephone Encounter (Signed)
Patient notified of recommendations.  She wants to have labs drawn with her primary care at her follow up with her on 7/26

## 2013-11-27 ENCOUNTER — Other Ambulatory Visit: Payer: Self-pay

## 2013-11-27 DIAGNOSIS — Z1231 Encounter for screening mammogram for malignant neoplasm of breast: Secondary | ICD-10-CM

## 2013-12-09 ENCOUNTER — Ambulatory Visit
Admission: RE | Admit: 2013-12-09 | Discharge: 2013-12-09 | Disposition: A | Discharge status: Home / Self Care | Payer: 59 | Source: Ambulatory Visit

## 2013-12-09 ENCOUNTER — Ambulatory Visit: Payer: 59

## 2013-12-09 DIAGNOSIS — Z1231 Encounter for screening mammogram for malignant neoplasm of breast: Secondary | ICD-10-CM

## 2013-12-16 ENCOUNTER — Telehealth: Payer: Self-pay | Admitting: Internal Medicine

## 2013-12-16 NOTE — Telephone Encounter (Signed)
Patient notified that she does not need to be fasting.

## 2013-12-18 ENCOUNTER — Telehealth: Payer: Self-pay | Admitting: Internal Medicine

## 2013-12-18 NOTE — Telephone Encounter (Signed)
;  absdrawn at there primary care  ALT is 53  AST 54 Both previous results were 80.    She will have her primary care fax the labs

## 2014-01-08 NOTE — Telephone Encounter (Signed)
Spoke with pt and she is aware. States she will have to look at her calendar and call back to schedule.

## 2014-01-08 NOTE — Telephone Encounter (Signed)
Let her know LFT abnormalities not likely  a major problem  She should schedule a non-urgent next available f/u with me to review

## 2014-01-08 NOTE — Telephone Encounter (Signed)
Pt states she is calling her other office and having labs faxed over for Dr. [name] to review the official report. States she really does not have the money to come for a visit but if she has to she will. Pt thought since levels were down that she would not need a visit. Dr. [name] aware.

## 2014-01-09 NOTE — Telephone Encounter (Signed)
She can wait on the visit then - I will call her instead

## 2014-01-16 ENCOUNTER — Encounter: Payer: Self-pay | Admitting: Internal Medicine

## 2014-01-16 ENCOUNTER — Telehealth: Payer: Self-pay | Admitting: Internal Medicine

## 2014-01-16 NOTE — Telephone Encounter (Signed)
Per Dr. [name] he has reviewed the labs.  Elevations in her LFTs are from her fatty liver.  She is asked to continue to work on her weight and manage her diabetes.  She is advised to get her LFTs rechecked in 3 months.  She states that she has an appt with her primary care to do that in December.  She has scheduled for colonoscopy and Dr. [name] will speak with her at that time. She verbalized understanding of all instructions.

## 2014-01-17 ENCOUNTER — Encounter: Payer: Self-pay | Admitting: Internal Medicine

## 2014-03-14 ENCOUNTER — Ambulatory Visit (AMBULATORY_SURGERY_CENTER): Payer: Self-pay | Admitting: *Deleted

## 2014-03-14 VITALS — Ht 67.0 in | Wt 231.6 lb

## 2014-03-14 DIAGNOSIS — K5190 Ulcerative colitis, unspecified, without complications: Secondary | ICD-10-CM

## 2014-03-14 DIAGNOSIS — K5100 Ulcerative (chronic) pancolitis without complications: Secondary | ICD-10-CM

## 2014-03-14 NOTE — Progress Notes (Signed)
No allergies to eggs or soy. No problems with anesthesia.  Pt given Emmi instructions for colonoscopy  No oxygen use  No diet drug use  

## 2014-03-19 ENCOUNTER — Encounter: Payer: Self-pay | Admitting: Internal Medicine

## 2014-03-28 ENCOUNTER — Encounter: Payer: Self-pay | Admitting: Internal Medicine

## 2014-03-28 ENCOUNTER — Ambulatory Visit (AMBULATORY_SURGERY_CENTER): Payer: 59 | Admitting: Internal Medicine

## 2014-03-28 VITALS — BP 124/77 | HR 59 | Temp 96.8°F | Resp 16 | Ht 67.0 in | Wt 231.0 lb

## 2014-03-28 DIAGNOSIS — D125 Benign neoplasm of sigmoid colon: Secondary | ICD-10-CM | POA: Diagnosis not present

## 2014-03-28 DIAGNOSIS — Z1211 Encounter for screening for malignant neoplasm of colon: Secondary | ICD-10-CM

## 2014-03-28 DIAGNOSIS — K5190 Ulcerative colitis, unspecified, without complications: Secondary | ICD-10-CM

## 2014-03-28 MED ORDER — SODIUM CHLORIDE 0.9 % IV SOLN: 500.0000 mL | INTRAVENOUS | Status: DC

## 2014-03-28 NOTE — Op Note (Signed)
[facility]  [address] [place], [id number]   COLONOSCOPY PROCEDURE REPORT  PATIENT: Beverly Sandoval, Beverly Sandoval  MR#: [id number] BIRTHDATE: 02/24/1961 , 57  yrs. old GENDER: female ENDOSCOPIST: Gatha Mayer, MD, [facility] PROCEDURE DATE:  03/28/2014 PROCEDURE:   Colonoscopy with biopsy First Screening Colonoscopy - Avg.  risk and is 50 yrs.  old or older - No.  Prior Negative Screening - Now for repeat screening. N/A  History of Adenoma - Now for follow-up colonoscopy & has been > or = to 3 yrs.  N/A  Polyps Removed Today? Yes. ASA CLASS:   Class II INDICATIONS:previously diagnosed pancolonic ulcerative colitis and previously diagnosed pancolonic ulcerative colitis with current disease duration > 8 years. MEDICATIONS: Propofol 300 mg IV and Monitored anesthesia care  DESCRIPTION OF PROCEDURE:   After the risks benefits and alternatives of the procedure were thoroughly explained, informed consent was obtained.  The digital rectal exam revealed no abnormalities of the rectum.   The LB [id number]  endoscope was introduced through the anus and advanced to the cecum, which was identified by both the appendix and ileocecal valve. No adverse events experienced.   The quality of the prep was good, using MiraLax  The instrument was then slowly withdrawn as the colon was fully examined.   COLON FINDINGS: A sessile polyp measuring 3 mm in size was found in the sigmoid colon.  A polypectomy was performed with cold forceps. The colonic mucosa appeared normal throughout the entire examined colon.  Multiple random biopsies were performed using cold forceps in UC surveillance fashion.  Sample was obtained and sent to histology.  Retroflexed views revealed no abnormalities. The time to cecum=1 minutes 20 seconds.  Withdrawal time=14 minutes 54 seconds.  The scope was withdrawn and the procedure completed. COMPLICATIONS: There were no immediate complications.  ENDOSCOPIC  IMPRESSION: 1.   Sessile polyp measuring 3 mm in size was found in the sigmoid colon; polypectomy was performed with cold forceps - surrounding mucosa also removed 2.   The colonic mucosa appeared normal throughout the entire examined colon; multiple random biopsies were performed using cold forceps  RECOMMENDATIONS: Timing of repeat colonoscopy will be determined by pathology findings. Likely 3 years  eSigned:  Gatha Mayer, MD, [facility] 03/28/2014 10:45 AM   cc: The Patient and Doug Sou MD

## 2014-03-28 NOTE — Progress Notes (Signed)
Procedure ends, to recovery, report given and VSS. 

## 2014-03-28 NOTE — Progress Notes (Signed)
Called to room to assist during endoscopic procedure.  Patient ID and intended procedure confirmed with present staff. Received instructions for my participation in the procedure from the performing physician.  

## 2014-03-28 NOTE — Patient Instructions (Addendum)
The colon looks normal - good news! Routine biopsies taken and one tiny polyp removed (not to worry - looks benign and should not be a problem).  I will let you know pathology results and when to have another routine colonoscopy by mail.  Great work on the weight loss. Congratulations!  I appreciate the opportunity to care for you. Gatha Mayer, MD, FACG  YOU HAD AN ENDOSCOPIC PROCEDURE TODAY AT [facility]: Refer to the procedure report that was given to you for any specific questions about what was found during the examination.  If the procedure report does not answer your questions, please call your gastroenterologist to clarify.  If you requested that your care partner not be given the details of your procedure findings, then the procedure report has been included in a sealed envelope for you to review at your convenience later.  YOU SHOULD EXPECT: Some feelings of bloating in the abdomen. Passage of more gas than usual.  Walking can help get rid of the air that was put into your GI tract during the procedure and reduce the bloating. If you had a lower endoscopy (such as a colonoscopy or flexible sigmoidoscopy) you may notice spotting of blood in your stool or on the toilet paper. If you underwent a bowel prep for your procedure, then you may not have a normal bowel movement for a few days.  DIET: Your first meal following the procedure should be a light meal and then it is ok to progress to your normal diet.  A half-sandwich or bowl of soup is an example of a good first meal.  Heavy or fried foods are harder to digest and may make you feel nauseous or bloated.  Likewise meals heavy in dairy and vegetables can cause extra gas to form and this can also increase the bloating.  Drink plenty of fluids but you should avoid alcoholic beverages for 24 hours.  ACTIVITY: Your care partner should take you home directly after the procedure.  You should plan to take it easy,  moving slowly for the rest of the day.  You can resume normal activity the day after the procedure however you should NOT DRIVE or use heavy machinery for 24 hours (because of the sedation medicines used during the test).    SYMPTOMS TO REPORT IMMEDIATELY: A gastroenterologist can be reached at any hour.  During normal business hours, 8:30 AM to 5:00 PM Monday through Friday, call [phone].  After hours and on weekends, please call the GI answering service at [phone] who will take a message and have the physician on call contact you.   Following lower endoscopy (colonoscopy or flexible sigmoidoscopy):  Excessive amounts of blood in the stool  Significant tenderness or worsening of abdominal pains  Swelling of the abdomen that is new, acute  Fever of 100F or higher  FOLLOW UP: If any biopsies were taken you will be contacted by phone or by letter within the next 1-3 weeks.  Call your gastroenterologist if you have not heard about the biopsies in 3 weeks.  Our staff will call the home number listed on your records the next business day following your procedure to check on you and address any questions or concerns that you may have at that time regarding the information given to you following your procedure. This is a courtesy call and so if there is no answer at the home number and we have not heard from you through the  emergency physician on call, we will assume that you have returned to your regular daily activities without incident.  SIGNATURES/CONFIDENTIALITY: You and/or your care partner have signed paperwork which will be entered into your electronic medical record.  These signatures attest to the fact that that the information above on your After Visit Summary has been reviewed and is understood.  Full responsibility of the confidentiality of this discharge information lies with you and/or your care-partner.  Recommendations Next colonoscopy determined by pathology  results. Polyp handout provided to patient/care provider.

## 2014-03-31 ENCOUNTER — Telehealth: Payer: Self-pay | Admitting: *Deleted

## 2014-03-31 NOTE — Telephone Encounter (Signed)
  Follow up Call-  Call back number 03/28/2014  Post procedure Call Back phone  # [phone]  Permission to leave phone message Yes     Patient questions:  Message left for patient to call us if necessary.

## 2014-04-03 ENCOUNTER — Encounter: Payer: Self-pay | Admitting: Internal Medicine

## 2014-04-03 DIAGNOSIS — Z860100 Personal history of colon polyps, unspecified: Secondary | ICD-10-CM

## 2014-04-03 HISTORY — DX: Personal history of colonic polyps: Z86.010

## 2014-04-03 HISTORY — DX: Personal history of colon polyps, unspecified: Z86.0100

## 2014-04-03 NOTE — Progress Notes (Signed)
Quick Note:  Colitis in remission Diminutive adenoma Repeat colon 2018 ______

## 2014-06-17 ENCOUNTER — Other Ambulatory Visit: Payer: Self-pay | Admitting: Internal Medicine

## 2014-06-17 MED ORDER — MESALAMINE 1.2 G PO TBEC: 2.4000 g | DELAYED_RELEASE_TABLET | Freq: Two times a day (BID) | ORAL | Status: DC

## 2014-06-17 NOTE — Telephone Encounter (Signed)
rx sent.  Patient just had colonoscopy in 03/2014.  1 year refills sent

## 2014-12-04 ENCOUNTER — Telehealth: Payer: Self-pay | Admitting: Internal Medicine

## 2014-12-04 NOTE — Telephone Encounter (Signed)
OK 

## 2014-12-04 NOTE — Telephone Encounter (Signed)
Patient reports that lost 35 lbs then regained 20 lbs. She states that she has not been eating right or exercising.  "I know what I need to do".  Her triglycerides have increased to 286 from 185.  " I am starting back my exercise program today.  I know what I need to do". I contacted Dr. Dominica Severin office they are sending over the labs. I will put them in your office when they arrive

## 2014-12-17 ENCOUNTER — Other Ambulatory Visit: Payer: Self-pay

## 2014-12-17 DIAGNOSIS — Z1231 Encounter for screening mammogram for malignant neoplasm of breast: Secondary | ICD-10-CM

## 2014-12-23 ENCOUNTER — Ambulatory Visit: Payer: Self-pay

## 2014-12-25 ENCOUNTER — Ambulatory Visit: Payer: Self-pay

## 2014-12-25 ENCOUNTER — Ambulatory Visit (INDEPENDENT_AMBULATORY_CARE_PROVIDER_SITE_OTHER): Payer: 59

## 2014-12-25 DIAGNOSIS — Z1231 Encounter for screening mammogram for malignant neoplasm of breast: Secondary | ICD-10-CM | POA: Diagnosis not present

## 2015-02-24 ENCOUNTER — Telehealth: Payer: Self-pay | Admitting: Internal Medicine

## 2015-02-24 NOTE — Telephone Encounter (Signed)
Patient had questions about fasting lab work

## 2015-03-02 ENCOUNTER — Other Ambulatory Visit (INDEPENDENT_AMBULATORY_CARE_PROVIDER_SITE_OTHER): Payer: 59

## 2015-03-02 ENCOUNTER — Encounter: Payer: Self-pay | Admitting: Internal Medicine

## 2015-03-02 ENCOUNTER — Ambulatory Visit (INDEPENDENT_AMBULATORY_CARE_PROVIDER_SITE_OTHER): Payer: 59 | Admitting: Internal Medicine

## 2015-03-02 VITALS — BP 106/72 | HR 84 | Ht 66.0 in | Wt 236.0 lb

## 2015-03-02 DIAGNOSIS — K5100 Ulcerative (chronic) pancolitis without complications: Secondary | ICD-10-CM | POA: Diagnosis not present

## 2015-03-02 DIAGNOSIS — R740 Nonspecific elevation of levels of transaminase and lactic acid dehydrogenase [LDH]: Secondary | ICD-10-CM

## 2015-03-02 DIAGNOSIS — R748 Abnormal levels of other serum enzymes: Secondary | ICD-10-CM

## 2015-03-02 NOTE — Progress Notes (Signed)
   Subjective:    Patient ID: Beverly Sandoval, female    DOB: December 11, 1960, 54 y.o.   MRN: [id number] Chief complaint abnormal liver tests HPI The patient is here for follow-up. She has a history of abnormal transaminases off and on in the past that I've attributed to fatty liver disease. She had lost weight in the mid normalized within the last year or so they been up some again. In July the AST was 53 and ALT 55 and top normal is 40 for both of those. Nathaneil Canary rides to 66 cholesterol 204. LDL 106. Hemoglobin A1c 5.8%.  Her colitis is under good control without symptoms. She is not a significant user of alcohol.  Medications, allergies, past medical history, past surgical history, family history and social history are reviewed and updated in the EMR.  Review of Systems As above    Objective:   Physical Exam @BP  106/72 mmHg  Pulse 84  Ht 5\' 6"  (1.676 m)  Wt 236 lb (107.049 kg)  BMI 38.11 kg/m2@  General:  NAD, pleasant obese Eyes:   anicteric Abdomen:  soft and nontender, BS+ I do not detect any hepatosplenomegaly or mass or stigmata of chronic liver disease Ext:   no edema, cyanosis or clubbing    Data Reviewed:   Wt Readings from Last 3 Encounters:  03/02/15 236 lb (107.049 kg)  03/28/14 231 lb (104.781 kg)  03/14/14 231 lb 9.6 oz (105.053 kg)   As per history of present illness      Assessment & Plan:   1. Abnormal transaminases   2. Universal ulcerative colitis, without complications ([place])    Fatty liver is the most likely cause of her problems but given the persistence we'll go ahead and get a limited right upper quadrant ultrasound of the liver, repeat her transaminases, ferritin to screen for iron overload, CBC, hepatitis B serologies and hepatitis C antibody and an antinuclear antibody. Further plans pending these results.  Ulcerative colitis is under good control she will continue her mesalamine.  I appreciate the opportunity to care for this patient. CC:  Nicola Girt, DO

## 2015-03-02 NOTE — Patient Instructions (Signed)
  Your physician has requested that you go to the basement for the lab work before leaving today.    You have been scheduled for an abdominal ultrasound at [place] on 03/05/15 at 3:15pm. Please arrive 15 minutes prior to your appointment for registration. Make certain not to have anything to eat or drink 6 hours prior to your appointment. Should you need to reschedule your appointment, please contact radiology at [phone]. This test typically takes about 30 minutes to perform.   I appreciate the opportunity to care for you. Silvano Rusk, MD, [facility]

## 2015-03-03 ENCOUNTER — Ambulatory Visit (INDEPENDENT_AMBULATORY_CARE_PROVIDER_SITE_OTHER): Payer: 59

## 2015-03-03 DIAGNOSIS — K8020 Calculus of gallbladder without cholecystitis without obstruction: Secondary | ICD-10-CM

## 2015-03-03 DIAGNOSIS — R748 Abnormal levels of other serum enzymes: Secondary | ICD-10-CM

## 2015-03-03 DIAGNOSIS — R740 Nonspecific elevation of levels of transaminase and lactic acid dehydrogenase [LDH]: Secondary | ICD-10-CM

## 2015-03-03 LAB — HEPATIC FUNCTION PANEL
ALBUMIN: 4.4 g/dL (ref 3.5–5.2)
ALK PHOS: 78 U/L (ref 39–117)
ALT: 39 U/L — ABNORMAL HIGH (ref 0–35)
AST: 45 U/L — ABNORMAL HIGH (ref 0–37)
Bilirubin, Direct: 0.1 mg/dL (ref 0.0–0.3)
Total Bilirubin: 0.7 mg/dL (ref 0.2–1.2)
Total Protein: 8 g/dL (ref 6.0–8.3)

## 2015-03-03 LAB — ANA: ANA: NEGATIVE

## 2015-03-03 LAB — HEPATITIS C ANTIBODY: HCV Ab: NEGATIVE

## 2015-03-03 LAB — HEPATITIS B CORE ANTIBODY, TOTAL: HEP B C TOTAL AB: NONREACTIVE

## 2015-03-03 LAB — HEPATITIS B SURFACE ANTIGEN: HEP B S AG: NEGATIVE

## 2015-03-03 LAB — FERRITIN: FERRITIN: 76.8 ng/mL (ref 10.0–291.0)

## 2015-03-05 ENCOUNTER — Other Ambulatory Visit: Payer: 59

## 2015-03-05 ENCOUNTER — Telehealth: Payer: Self-pay | Admitting: Internal Medicine

## 2015-03-05 NOTE — Telephone Encounter (Signed)
See [place] results for additional details

## 2015-03-05 NOTE — Progress Notes (Signed)
Quick Note:  [place] - c/w fatty liver Labs also ______

## 2015-03-05 NOTE — Progress Notes (Signed)
Quick Note:  My Chart message ______

## 2015-03-09 ENCOUNTER — Ambulatory Visit: Payer: 59 | Admitting: Osteopathic Medicine

## 2015-07-03 ENCOUNTER — Telehealth: Payer: Self-pay | Admitting: Internal Medicine

## 2015-07-03 MED ORDER — MESALAMINE 1.2 G PO TBEC: 2.4000 g | DELAYED_RELEASE_TABLET | Freq: Two times a day (BID) | ORAL | Status: DC

## 2015-07-03 NOTE — Telephone Encounter (Signed)
Refill sent in as requested. 

## 2015-07-03 NOTE — Telephone Encounter (Signed)
Called and canceled refill of Lialda sent to [facility] and re-sent it to [facility] which is [facility] as patient requested.

## 2015-07-07 ENCOUNTER — Other Ambulatory Visit: Payer: Self-pay | Admitting: Internal Medicine

## 2015-07-07 NOTE — Telephone Encounter (Signed)
Left patient message to call me to clarify pharmacy.  Lialda was sent in 07/03/15 to [facility].

## 2015-07-07 NOTE — Telephone Encounter (Signed)
Spoke with patient and she said she has never used [facility] so will take it out of her chart and resend lialda to [facility].  [facility] and [facility] it.

## 2015-10-02 ENCOUNTER — Other Ambulatory Visit: Payer: Self-pay | Admitting: Internal Medicine

## 2015-11-06 IMAGING — MG MM DIGITAL SCREENING
4 series · 4 of 4 positions shown · non-contrast
Comparison: Previous exam(s).

CLINICAL DATA: Screening.

EXAM:
DIGITAL SCREENING BILATERAL MAMMOGRAM WITH CAD

[R CC]
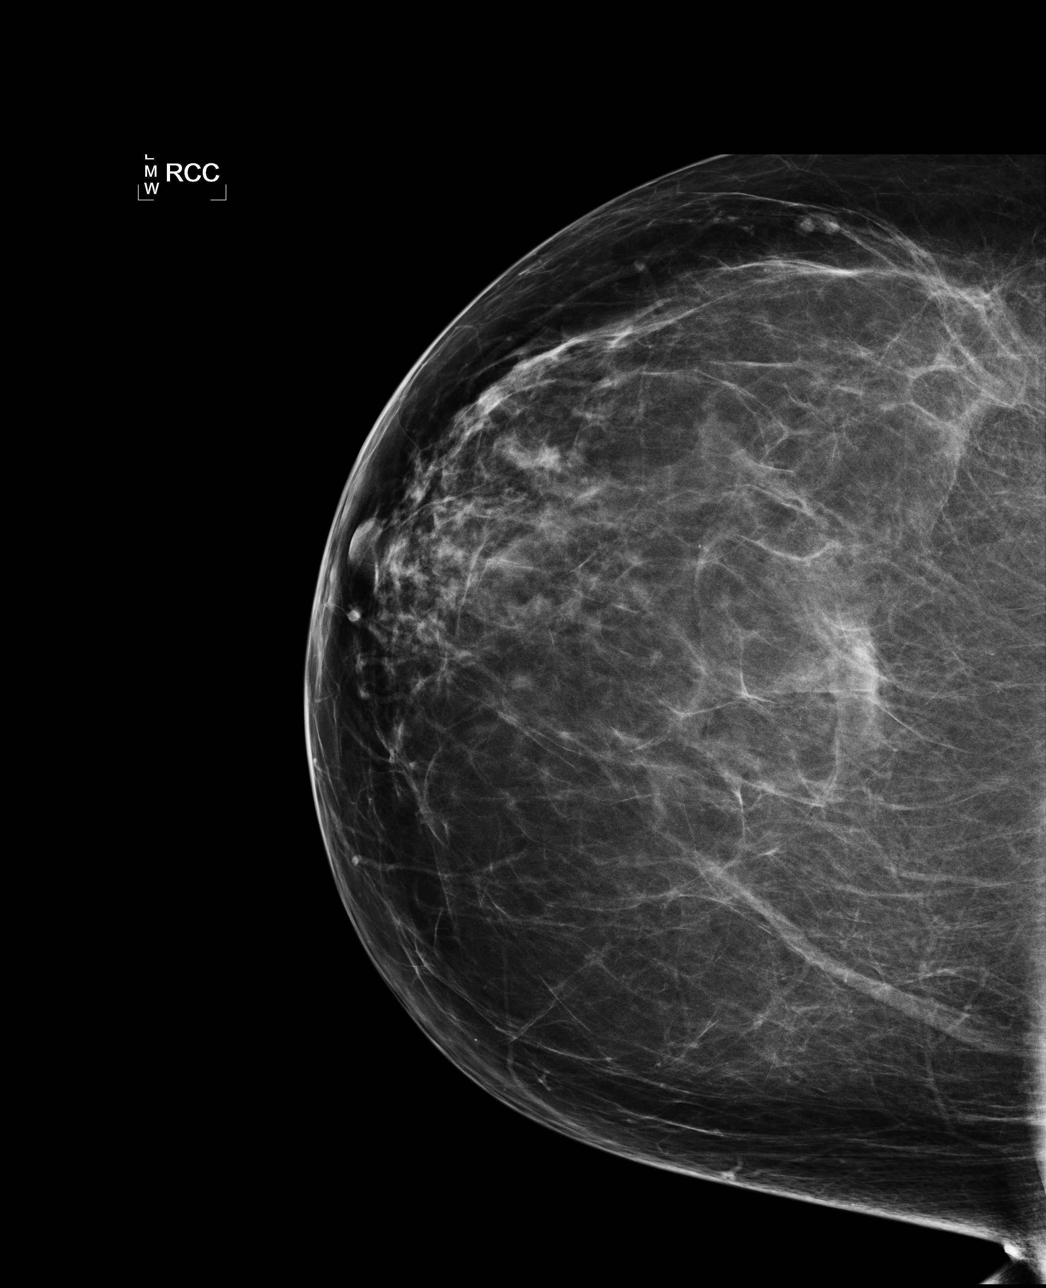

[L CC]
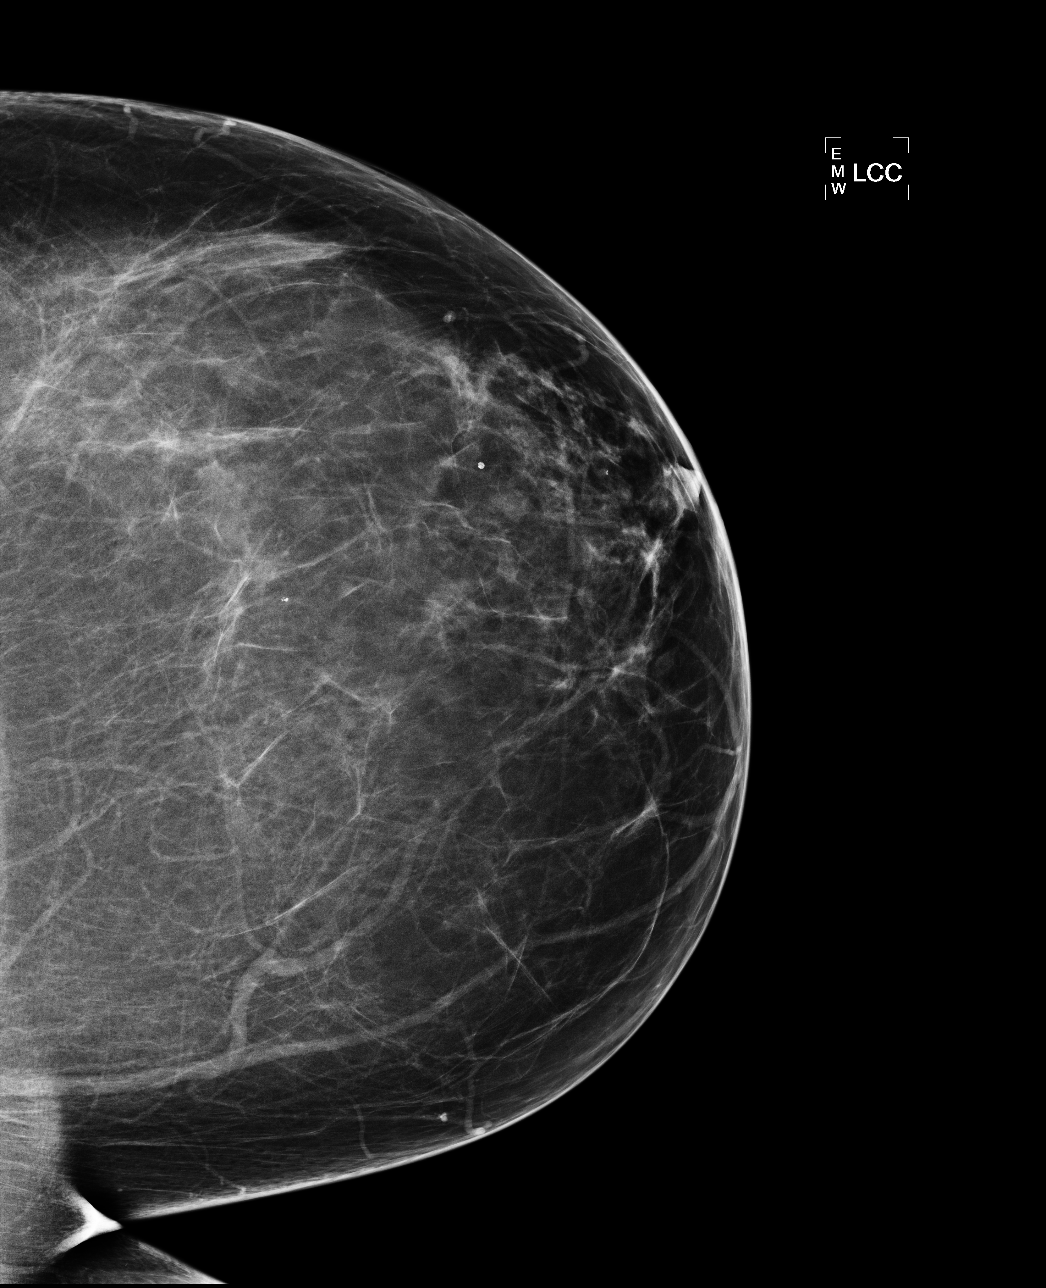

[L MLO]
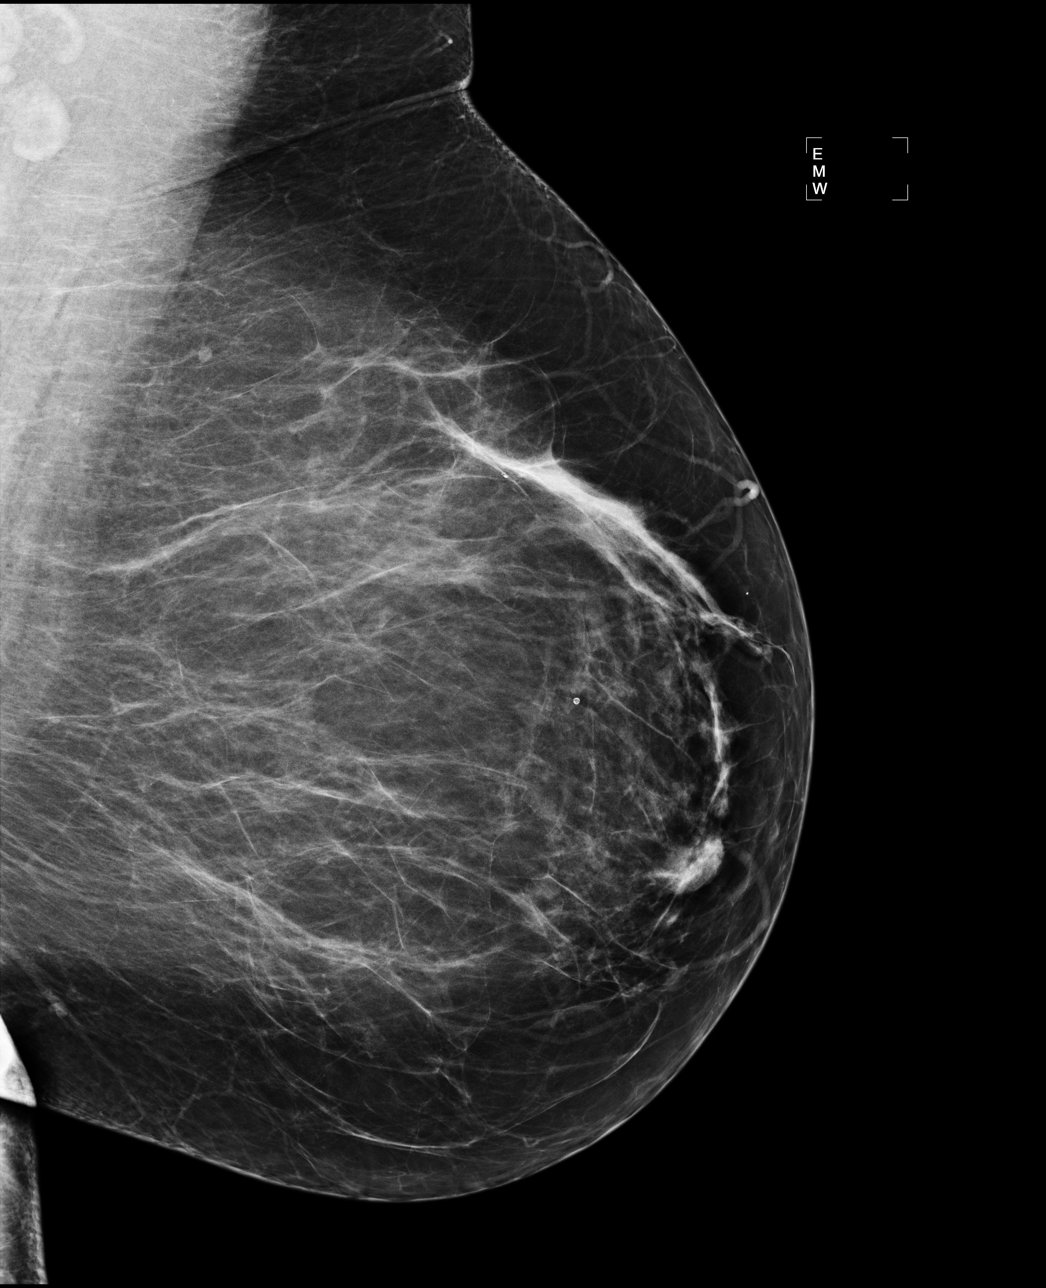

[R MLO]
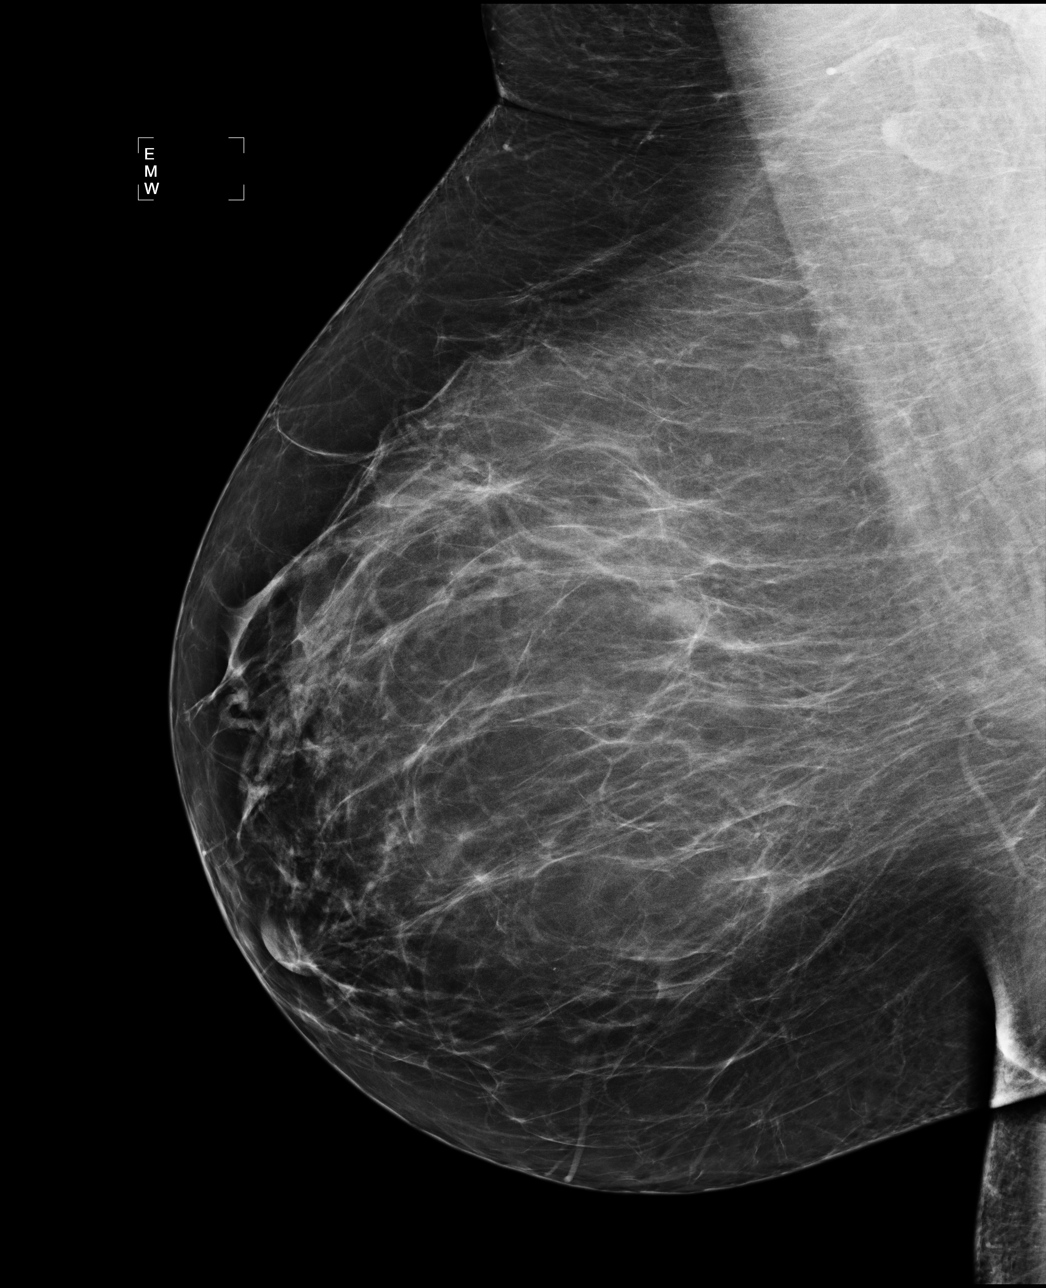

[4 of 4 positions shown; findings below may reference images not displayed]

ACR Breast Density Category b: There are scattered areas of
fibroglandular density.
FINDINGS: There are no findings suspicious for malignancy. Images were
processed with CAD.
IMPRESSION: No mammographic evidence of malignancy. A result letter of this
screening mammogram will be mailed directly to the patient.

RECOMMENDATION:
Screening mammogram in one year. (Code:AS-G-LCT)

BI-RADS CATEGORY  1: Negative.

## 2015-12-02 ENCOUNTER — Ambulatory Visit: Payer: 59 | Admitting: Physician Assistant

## 2016-01-04 ENCOUNTER — Other Ambulatory Visit: Payer: Self-pay | Admitting: Internal Medicine

## 2016-01-27 ENCOUNTER — Other Ambulatory Visit: Payer: Self-pay | Admitting: Internal Medicine

## 2016-01-27 DIAGNOSIS — Z1231 Encounter for screening mammogram for malignant neoplasm of breast: Secondary | ICD-10-CM

## 2016-02-01 ENCOUNTER — Ambulatory Visit
Admission: RE | Admit: 2016-02-01 | Discharge: 2016-02-01 | Disposition: A | Discharge status: Home / Self Care | Payer: 59 | Source: Ambulatory Visit | Attending: Internal Medicine | Admitting: Internal Medicine

## 2016-02-01 DIAGNOSIS — Z1231 Encounter for screening mammogram for malignant neoplasm of breast: Secondary | ICD-10-CM

## 2016-02-18 ENCOUNTER — Telehealth: Payer: Self-pay | Admitting: Internal Medicine

## 2016-02-18 NOTE — Telephone Encounter (Signed)
Ok to take short course of ibuprofen for recent root canal?

## 2016-02-18 NOTE — Telephone Encounter (Signed)
It might but reasonable to use some for a few days

## 2016-02-18 NOTE — Telephone Encounter (Signed)
Patient notified

## 2016-03-31 ENCOUNTER — Other Ambulatory Visit: Payer: Self-pay

## 2016-03-31 ENCOUNTER — Other Ambulatory Visit: Payer: Self-pay | Admitting: Internal Medicine

## 2016-05-26 ENCOUNTER — Telehealth: Payer: Self-pay | Admitting: Internal Medicine

## 2016-05-26 NOTE — Telephone Encounter (Signed)
Yes Sir the generic is out just wanted to make sure that was okay with you, thank you for your time.

## 2016-05-26 NOTE — Telephone Encounter (Signed)
Please advise Sir, thanks. 

## 2016-05-26 NOTE — Telephone Encounter (Signed)
I believe generic Doristine Johns is out or coming but not sure  Does she have a formulary to tell us if others like Delzicol, Asacol HD Apriso are options?

## 2016-05-27 NOTE — Telephone Encounter (Signed)
Spoke with Cashlynn and told her per our Lialda rep the generic is made by the exact company that made the [place], it's just a name change. She had seen on the Internet that it was made in a dirty factory in [place].  She is going to call her insurance company and see what other drugs are on her formulary and will call us back with those names.  But she is comfortable taking the Lialda if it's the same company.  I told her Dr [name] is at the hospital and will return next week.     Rheba spoke with her insurance company and said the generic Lialda is only $40.00 for 3 months worth.  Asacol HD is $120 for 3 months and Apriso 0.375mg  is $178 for 3 months.  She wonders if she was on asacol when she went into remission.  Will investigate that and route this to Dr [name].    She said we have some time, she has 4-5 months worth of the Lialda on hand.

## 2016-05-27 NOTE — Telephone Encounter (Signed)
I think she should try the generic Lialda

## 2016-05-27 NOTE — Telephone Encounter (Signed)
Patient has questions reqarding generic lialda. Best # [phone]

## 2016-05-30 NOTE — Telephone Encounter (Signed)
Donta informed and will call us when she needs a refill, which will be several months from now.

## 2016-07-25 ENCOUNTER — Telehealth: Payer: Self-pay | Admitting: Internal Medicine

## 2016-07-25 MED ORDER — MESALAMINE 1.2 G PO TBEC: DELAYED_RELEASE_TABLET | ORAL | 0 refills | Status: DC

## 2016-07-25 MED ORDER — MESALAMINE 1.2 G PO TBEC: DELAYED_RELEASE_TABLET | ORAL | 1 refills | Status: DC

## 2016-07-25 NOTE — Telephone Encounter (Signed)
Left a message for patient to call me back, want to confirm pharmacy.  We have [facility] not [facility].

## 2016-07-25 NOTE — Telephone Encounter (Signed)
I reached Beverly Sandoval on her cell # and she said she has switched to [facility] from [facility].  I changed the pharmacy in her chart and sent in her generic Lialda as requested.

## 2017-01-05 ENCOUNTER — Telehealth: Payer: Self-pay | Admitting: Internal Medicine

## 2017-01-05 NOTE — Telephone Encounter (Signed)
Patient advised and will try adding the Benefiber daily.

## 2017-01-05 NOTE — Telephone Encounter (Signed)
Patient started taking generic Lialda 2 weeks ago and has noticed that she is not having her consistent daily bm. Usually every other day now, complaining of bloated feeling, no abdominal pain. Please advise.

## 2017-01-05 NOTE — Telephone Encounter (Signed)
I dont' think its a problem - would monitor and see  She can take 1-3 teaspoons of Benefiber (generic ok) daily if she feesl uncomfortable with less defecation

## 2017-01-13 ENCOUNTER — Telehealth: Payer: Self-pay | Admitting: Internal Medicine

## 2017-01-13 NOTE — Telephone Encounter (Signed)
Agree 

## 2017-01-13 NOTE — Telephone Encounter (Signed)
Patient reports loose stools occasionally, no urgency or bloating.  All of these symptoms just started.  She also reports that she has totally changed her diet about 1 week ago and has started on Vivance for a return to school.  She has started to eat mostly fruits and vegetables, stopped all sugars and carbs. I advised her new symptoms are most likely due to the recent dietary changes and the abrupt increase in fiber in her diet.  She will try Gas -x or IBGard.  She will call me back if her symptoms change and she has new UC symptoms.

## 2017-01-19 ENCOUNTER — Emergency Department (HOSPITAL_BASED_OUTPATIENT_CLINIC_OR_DEPARTMENT_OTHER): Payer: BLUE CROSS/BLUE SHIELD

## 2017-01-19 ENCOUNTER — Emergency Department (HOSPITAL_BASED_OUTPATIENT_CLINIC_OR_DEPARTMENT_OTHER)
Admission: EM | Admit: 2017-01-19 | Discharge: 2017-01-19 | Disposition: A | Discharge status: Home / Self Care | Payer: BLUE CROSS/BLUE SHIELD | Attending: Emergency Medicine | Admitting: Emergency Medicine

## 2017-01-19 ENCOUNTER — Encounter (HOSPITAL_BASED_OUTPATIENT_CLINIC_OR_DEPARTMENT_OTHER): Payer: Self-pay | Admitting: *Deleted

## 2017-01-19 DIAGNOSIS — R079 Chest pain, unspecified: Secondary | ICD-10-CM

## 2017-01-19 DIAGNOSIS — E119 Type 2 diabetes mellitus without complications: Secondary | ICD-10-CM | POA: Diagnosis not present

## 2017-01-19 DIAGNOSIS — I10 Essential (primary) hypertension: Secondary | ICD-10-CM | POA: Diagnosis not present

## 2017-01-19 HISTORY — DX: Other specified behavioral and emotional disorders with onset usually occurring in childhood and adolescence: F98.8

## 2017-01-19 LAB — CBC
HEMATOCRIT: 40.2 % (ref 36.0–46.0)
Hemoglobin: 14.2 g/dL (ref 12.0–15.0)
MCH: 31.4 pg (ref 26.0–34.0)
MCHC: 35.3 g/dL (ref 30.0–36.0)
MCV: 88.9 fL (ref 78.0–100.0)
Platelets: 220 10*3/uL (ref 150–400)
RBC: 4.52 MIL/uL (ref 3.87–5.11)
RDW: 12.1 % (ref 11.5–15.5)
WBC: 6.5 10*3/uL (ref 4.0–10.5)

## 2017-01-19 LAB — BASIC METABOLIC PANEL
Anion gap: 11 (ref 5–15)
BUN: 18 mg/dL (ref 6–20)
CO2: 27 mmol/L (ref 22–32)
Calcium: 9.5 mg/dL (ref 8.9–10.3)
Chloride: 98 mmol/L — ABNORMAL LOW (ref 101–111)
Creatinine, Ser: 0.85 mg/dL (ref 0.44–1.00)
GFR calc Af Amer: >60 mL/min (ref >60)
GLUCOSE: 120 mg/dL — AB (ref 65–99)
POTASSIUM: 3.4 mmol/L — AB (ref 3.5–5.1)
Sodium: 136 mmol/L (ref 135–145)

## 2017-01-19 LAB — TROPONIN I: Troponin I: <0.03 ng/mL (ref <0.03)

## 2017-01-19 MED ORDER — IOPAMIDOL (ISOVUE-370) INJECTION 76%
100.0000 mL | Freq: Once | INTRAVENOUS | Status: AC | PRN
  Administered 2017-01-19: 100 mL via INTRAVENOUS

## 2017-01-19 NOTE — ED Provider Notes (Signed)
Emergency Department Provider Note   I have reviewed the triage vital signs and the nursing notes.   HISTORY  Chief Complaint Chest Pain   HPI Beverly Sandoval is a 56 y.o. female with a past medical history of hypertension, diabetes and obesity presents to the emergency department today secondary to chest pain. Patient states that she's had it multiple times over the last few days without a pattern. Doesn't seem to be worsened after eating or after exertion. She states specifically that exertion makes her chest pain better. The slight pressure but also slightly sore. She feels like it could be indigestion but is different than her normal indigestion. She does have some shortness of breath sometimes with that but not always. No nausea, vomiting or diarrhea increases. No lightheadedness associated with it. No lower show any swelling or recent travels. Has had some minimal jaw pain. She feels that this may relate anxiety.   Past Medical History:  Diagnosis Date  . Abnormal LFTs   . ADD (attention deficit disorder)   . Allergic rhinitis   . diabetes   . Diabetes mellitus without complication ([place])   . 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 Headache(784.0)   . History of colonic polyp - adenoma 04/03/2014   03/2014 - diminutive adenoma - repeat colon 2018  . HTN (hypertension)   . Obesity   . Plantar fasciitis   . Pre-diabetes   . Universal ulcerative colitis [facility])     Patient Active Problem List   Diagnosis Date Noted  . History of colonic polyp - adenoma 04/03/2014  . ULCERATIVE COLITIS, UNIVERSAL 10/24/2007  . OBESITY 10/23/2007  . HYPERTENSION 10/23/2007  . HEADACHE, CHRONIC 10/23/2007    Past Surgical History:  Procedure Laterality Date  . ABLATION SAPHENOUS VEIN W/ RFA Right 2012   leg  . COLONOSCOPY  01/08/2009   Normal, ulcerative colitis in remission(multiple)  . GANGLION CYST EXCISION Left 1995   hand    Current Outpatient Rx  . Order #: [id number] Class:  Historical Med  . Order #: [id number] Class: Historical Med  . Order #: [id number] Class: Historical Med  . Order #: [id number] Class: Historical Med  . Order #: [id number] Class: Normal  . Order #: [id number] Class: Historical Med  . Order #: [id number] Class: Historical Med    Allergies Codeine  Family History  Problem Relation Age of Onset  . Prostate cancer Father   . Clotting disorder Father   . Diabetes Mother        MGM  . Colon cancer Neg Hx     Social History Social History  Substance Use Topics  . Smoking status: Never Smoker  . Smokeless tobacco: Never Used  . Alcohol use Yes     Comment: occasional    Review of Systems  All other systems negative except as documented in the HPI. All pertinent positives and negatives as reviewed in the HPI. ____________________________________________  PHYSICAL EXAM:  VITAL SIGNS: ED Triage Vitals  Enc Vitals Group     BP 01/19/17 1432 (!) 155/87     Pulse Rate 01/19/17 1432 84     Resp 01/19/17 1432 20     Temp 01/19/17 1432 98.6 F (37 C)     Temp src --      SpO2 01/19/17 1432 100 %     Weight 01/19/17 1429 236 lb (107 kg)     Height 01/19/17 1429 5' 7"  (1.702 m)     Head Circumference --      Peak Flow --  Pain Score 01/19/17 1429 4     Pain Loc --      Pain Edu? --      Excl. in [place]? --     Constitutional: Alert and oriented. Well appearing and in no acute distress. Eyes: Conjunctivae are normal. PERRL. EOMI. Head: Atraumatic. Nose: No congestion/rhinnorhea. Mouth/Throat: Mucous membranes are moist.  Oropharynx non-erythematous. Neck: No stridor.  No meningeal signs.   Cardiovascular: Normal rate, regular rhythm. Good peripheral circulation. Grossly normal heart sounds.   Respiratory: Normal respiratory effort.  No retractions. Lungs CTAB. Gastrointestinal: Soft and nontender. No distention.  Musculoskeletal: No lower extremity tenderness nor edema. No gross deformities of extremities. Neurologic:  Normal  speech and language. No gross focal neurologic deficits are appreciated.  Skin:  Skin is warm, dry and intact. No rash noted.  ____________________________________________   LABS (all labs ordered are listed, but only abnormal results are displayed)  Labs Reviewed  BASIC METABOLIC PANEL - Abnormal; Notable for the following:       Result Value   Potassium 3.4 (*)    Chloride 98 (*)    Glucose, Bld 120 (*)    All other components within normal limits  CBC  TROPONIN I  TROPONIN I   ____________________________________________  EKG   EKG Interpretation  Date/Time:  Thursday January 19 2017 18:41:28 EDT Ventricular Rate:  68 PR Interval:  156 QRS Duration: 96 QT Interval:  406 QTC Calculation: 432 R Axis:   156 Text Interpretation:  Right and left arm electrode reversal. non usable ecg Confirmed by Merrily Pew [phone]) on 01/19/2017 11:39:44 PM     ____________________________________________  RADIOLOGY  Dg Chest 2 View  Result Date: 01/19/2017 CLINICAL DATA:  Chest pain and shortness breath for 6 days. EXAM: CHEST  2 VIEW COMPARISON:  01/16/2017 FINDINGS: Normal heart size and mediastinal contours. Streaky density at the right base that is asymmetric and increased in prominence. No edema, effusion, or pneumothorax. Thoracic endplate spurring. No acute osseous finding. IMPRESSION: Mild atelectatic type opacity at the right base. Electronically Signed   By: Monte Fantasia M.D.   On: 01/19/2017 15:48   Ct Angio Chest Pe W And/or Wo Contrast  Result Date: 01/19/2017 CLINICAL DATA:  Chest pain and dyspnea x6 days. EXAM: CT ANGIOGRAPHY CHEST WITH CONTRAST TECHNIQUE: Multidetector CT imaging of the chest was performed using the standard protocol during bolus administration of intravenous contrast. Multiplanar CT image reconstructions and MIPs were obtained to evaluate the vascular anatomy. CONTRAST:  100 cc Isovue 370 COMPARISON:  01/19/2017 CXR FINDINGS: Cardiovascular: The study  is of quality for the evaluation of pulmonary embolism. There are no filling defects in the central, lobar, segmental or subsegmental pulmonary artery branches to suggest acute pulmonary embolism. Great vessels are normal in course and caliber. Normal heart size. No significant pericardial fluid/thickening. Mediastinum/Nodes: No discrete thyroid nodules. Unremarkable esophagus. No pathologically enlarged axillary, mediastinal or hilar lymph nodes. Lungs/Pleura: No pneumothorax. No pleural effusion. Tiny 2 mm right minor fissure, 5 mm right major fissure and 3 and 4 mm left major fissure nodules are noted, more commonly associated tiny intrafissure lymph nodes. There is a noncalcified subpleural left lower lobe 5 mm rounded nodule without calcification. No pneumonic consolidation. Upper abdomen: Unremarkable. Musculoskeletal: No aggressive appearing focal osseous lesions. Mild degenerative change along the lower thoracic spine with osteophytes. Review of the MIP images confirms the above findings. IMPRESSION: 1. No acute pulmonary embolus. 2. Small bilateral 5 mm or less pulmonary nodules most of which appear  to represent small intrafissure lymph nodes. No follow-up needed if patient is low-risk (and has no known or suspected primary neoplasm). Non-contrast chest CT can be considered in 12 months if patient is high-risk. This recommendation follows the consensus statement: Guidelines for Management of Incidental Pulmonary Nodules Detected on CT Images: From the Fleischner Society 2017; Radiology 2017; 284:228-243. Electronically Signed   By: Ashley Royalty M.D.   On: 01/19/2017 16:38   ____________________________________________   PROCEDURES  Procedure(s) performed:   Procedures ____________________________________________   INITIAL IMPRESSION / ASSESSMENT AND PLAN / ED COURSE  Pertinent labs & imaging results that were available during my care of the patient were reviewed by me and considered in my  medical decision making (see chart for details).  Secondary to age, risk factors patient is a low heart score and delta troponins done and negative. Had some type of opacity on chest x-ray so CT scan done to evaluate further and this was negative as well for PE, pneumonia, dissection, pneumothorax or other acute causes for chest pain. Overall I suspect that this is likely related to anxiety however she needs to quit her Vyvanse and follow-up with her primary doctor for further confirmation of this and consideration for possible stress test. ____________________________________________  FINAL CLINICAL IMPRESSION(S) / ED DIAGNOSES  Final diagnoses:  Nonspecific chest pain    MEDICATIONS GIVEN DURING THIS VISIT:  Medications  iopamidol (ISOVUE-370) 76 % injection 100 mL (100 mLs Intravenous Contrast Given 01/19/17 1623)    NEW OUTPATIENT MEDICATIONS STARTED DURING THIS VISIT:  Discharge Medication List as of 01/19/2017  8:14 PM      Note:  This document was prepared using Dragon voice recognition software and may include unintentional dictation errors.    Merrily Pew, MD 01/19/17 [phone]

## 2017-01-19 NOTE — ED Notes (Signed)
Pt amb to [facility]

## 2017-01-19 NOTE — ED Triage Notes (Signed)
Pt c/o mid sternal chest pain and SOB x 6 days after starting vyvance

## 2017-02-02 ENCOUNTER — Other Ambulatory Visit: Payer: Self-pay | Admitting: Internal Medicine

## 2017-02-02 DIAGNOSIS — Z1231 Encounter for screening mammogram for malignant neoplasm of breast: Secondary | ICD-10-CM

## 2017-02-04 ENCOUNTER — Other Ambulatory Visit: Payer: Self-pay | Admitting: Internal Medicine

## 2017-02-08 ENCOUNTER — Ambulatory Visit
Admission: RE | Admit: 2017-02-08 | Discharge: 2017-02-08 | Disposition: A | Discharge status: Home / Self Care | Payer: BLUE CROSS/BLUE SHIELD | Source: Ambulatory Visit | Attending: Internal Medicine | Admitting: Internal Medicine

## 2017-02-08 DIAGNOSIS — Z1231 Encounter for screening mammogram for malignant neoplasm of breast: Secondary | ICD-10-CM

## 2017-04-18 ENCOUNTER — Encounter: Payer: Self-pay | Admitting: Internal Medicine

## 2017-04-18 NOTE — Telephone Encounter (Signed)
Error

## 2017-05-08 ENCOUNTER — Other Ambulatory Visit: Payer: Self-pay | Admitting: Internal Medicine

## 2017-06-01 ENCOUNTER — Other Ambulatory Visit: Payer: Self-pay

## 2017-06-01 ENCOUNTER — Ambulatory Visit (AMBULATORY_SURGERY_CENTER): Payer: Self-pay | Admitting: *Deleted

## 2017-06-01 VITALS — Ht 67.0 in | Wt 240.0 lb

## 2017-06-01 DIAGNOSIS — Z86010 Personal history of colonic polyps: Secondary | ICD-10-CM

## 2017-06-01 NOTE — Progress Notes (Signed)
No egg or soy allergy known to patient  No issues with past sedation with any surgeries  or procedures, no intubation problems  No diet pills per patient No home 02 use per patient  No blood thinners per patient  Pt denies issues with constipation  No A fib or A flutter  EMMI video sent to pt's e mail - pt declined

## 2017-06-08 ENCOUNTER — Telehealth: Payer: Self-pay | Admitting: Internal Medicine

## 2017-06-08 ENCOUNTER — Encounter: Payer: Self-pay | Admitting: Internal Medicine

## 2017-06-08 NOTE — Telephone Encounter (Signed)
Patient last seen in 2016. She has a colonoscopy next week with Dr [name] on 06/15/17.  Please advise regarding refill.

## 2017-06-08 NOTE — Telephone Encounter (Signed)
Patient states she needs new prescription for medication lialda sent to new mail order pharmacy [facility] with fax [phone]. Pt would like a call when done.

## 2017-06-09 MED ORDER — MESALAMINE 1.2 G PO TBEC: DELAYED_RELEASE_TABLET | ORAL | 3 refills | Status: DC

## 2017-06-09 NOTE — Telephone Encounter (Signed)
Patient informed that Lialda refilled as requested to the new mail order she is using.

## 2017-06-09 NOTE — Telephone Encounter (Signed)
Ok to refill x 1 year 

## 2017-06-15 ENCOUNTER — Other Ambulatory Visit: Payer: Self-pay

## 2017-06-15 ENCOUNTER — Encounter: Payer: Self-pay | Admitting: Internal Medicine

## 2017-06-15 ENCOUNTER — Ambulatory Visit (AMBULATORY_SURGERY_CENTER): Payer: BLUE CROSS/BLUE SHIELD | Admitting: Internal Medicine

## 2017-06-15 VITALS — BP 112/71 | HR 75 | Temp 99.1°F | Resp 13 | Ht 66.0 in | Wt 236.0 lb

## 2017-06-15 DIAGNOSIS — K5100 Ulcerative (chronic) pancolitis without complications: Secondary | ICD-10-CM

## 2017-06-15 DIAGNOSIS — K599 Functional intestinal disorder, unspecified: Secondary | ICD-10-CM | POA: Diagnosis not present

## 2017-06-15 DIAGNOSIS — Z86010 Personal history of colonic polyps: Secondary | ICD-10-CM | POA: Diagnosis not present

## 2017-06-15 MED ORDER — SODIUM CHLORIDE 0.9 % IV SOLN: 500.0000 mL | Freq: Once | INTRAVENOUS | Status: DC

## 2017-06-15 NOTE — Progress Notes (Signed)
Called to room to assist during endoscopic procedure.  Patient ID and intended procedure confirmed with present staff. Received instructions for my participation in the procedure from the performing physician.  

## 2017-06-15 NOTE — Progress Notes (Signed)
Report to PACU, RN, vss, BBS= Clear.  

## 2017-06-15 NOTE — Patient Instructions (Addendum)
   The colon looks good - colitis in remission. No polyps. I took the usual biopsies and will let you know - should be 3 years for next exam.  Stay well and keep up the good work with weight loss!  I appreciate the opportunity to care for you. Gatha Mayer, MD, FACG   YOU HAD AN ENDOSCOPIC PROCEDURE TODAY: Refer to the procedure report and other information in the discharge instructions given to you for any specific questions about what was found during the examination. If this information does not answer your questions, please call [facility] office at [phone] to clarify.   YOU SHOULD EXPECT: Some feelings of bloating in the abdomen. Passage of more gas than usual. Walking can help get rid of the air that was put into your GI tract during the procedure and reduce the bloating. If you had a lower endoscopy (such as a colonoscopy or flexible sigmoidoscopy) you may notice spotting of blood in your stool or on the toilet paper. Some abdominal soreness may be present for a day or two, also.  DIET: Your first meal following the procedure should be a light meal and then it is ok to progress to your normal diet. A half-sandwich or bowl of soup is an example of a good first meal. Heavy or fried foods are harder to digest and may make you feel nauseous or bloated. Drink plenty of fluids but you should avoid alcoholic beverages for 24 hours. If you had a esophageal dilation, please see attached instructions for diet.    ACTIVITY: Your care partner should take you home directly after the procedure. You should plan to take it easy, moving slowly for the rest of the day. You can resume normal activity the day after the procedure however YOU SHOULD NOT DRIVE, use power tools, machinery or perform tasks that involve climbing or major physical exertion for 24 hours (because of the sedation medicines used during the test).   SYMPTOMS TO REPORT IMMEDIATELY: A gastroenterologist can be reached at any hour.  Please call [phone]  for any of the following symptoms:  Following lower endoscopy (colonoscopy, flexible sigmoidoscopy) Excessive amounts of blood in the stool  Significant tenderness, worsening of abdominal pains  Swelling of the abdomen that is new, acute  Fever of 100 or higher    FOLLOW UP:  If any biopsies were taken you will be contacted by phone or by letter within the next 1-3 weeks. Call [phone]  if you have not heard about the biopsies in 3 weeks.  Please also call with any specific questions about appointments or follow up tests.

## 2017-06-15 NOTE — Op Note (Signed)
[facility] Patient Name: Beverly Sandoval Procedure Date: 06/15/2017 8:22 AM MRN: [id number] Endoscopist: Gatha Mayer , MD Age: 57 Referring MD:  Date of Birth: May 24, 1960 Gender: Female Account #: [id number] Procedure:                Colonoscopy Indications:              Surveillance: Personal history of adenomatous                            polyps on last colonoscopy 3 years ago and                            universal ulcerative colitis surveillance Medicines:                Propofol per Anesthesia, Monitored Anesthesia Care Procedure:                Pre-Anesthesia Assessment:                           - Prior to the procedure, a History and Physical                            was performed, and patient medications and                            allergies were reviewed. The patient's tolerance of                            previous anesthesia was also reviewed. The risks                            and benefits of the procedure and the sedation                            options and risks were discussed with the patient.                            All questions were answered, and informed consent                            was obtained. Prior Anticoagulants: The patient has                            taken no previous anticoagulant or antiplatelet                            agents. ASA Grade Assessment: II - A patient with                            mild systemic disease. After reviewing the risks                            and benefits, the patient was deemed in  satisfactory condition to undergo the procedure.                           After obtaining informed consent, the colonoscope                            was passed under direct vision. Throughout the                            procedure, the patient's blood pressure, pulse, and                            oxygen saturations were monitored continuously. The   Colonoscope was introduced through the anus and                            advanced to the the terminal ileum, with                            identification of the appendiceal orifice and IC                            valve. The colonoscopy was performed without                            difficulty. The patient tolerated the procedure                            well. The quality of the bowel preparation was                            good. The bowel preparation used was Miralax. The                            terminal ileum, ileocecal valve, appendiceal                            orifice, and rectum were photographed. Scope In: 8:27:44 AM Scope Out: 8:42:25 AM Scope Withdrawal Time: 0 hours 12 minutes 46 seconds  Total Procedure Duration: 0 hours 14 minutes 41 seconds  Findings:                 The perianal and digital rectal examinations were                            normal.                           The terminal ileum appeared normal.                           The entire examined colon appeared normal on direct                            and retroflexion views.  Two biopsies were taken every 10 cm with a cold                            forceps from the cecum/asc; transverse;                            desc/sigmoid; sigmoid/rectum for ulcerative colitis                            surveillance. These biopsy specimens from the                            regions listed above were sent to Pathology.                            Verification of patient identification for the                            specimen was done. Estimated blood loss was minimal. Complications:            No immediate complications. Estimated Blood Loss:     Estimated blood loss was minimal. Impression:               - The examined portion of the ileum was normal.                           - The entire examined colon is normal on direct and                            retroflexion views.  ULCERATIVE COLITIS IN REMISSION                           - Biopsies for surveillance were taken from the                            cecum/asc; transverse; desc/sigmoid; sigmoid/rectum.                           - Personal history of colonic polyp - SPORADIC                            ADENOMA 2015. Recommendation:           - Patient has a contact number available for                            emergencies. The signs and symptoms of potential                            delayed complications were discussed with the                            patient. Return to normal activities tomorrow.  Written discharge instructions were provided to the                            patient.                           - Resume previous diet.                           - Continue present medications.                           - Repeat colonoscopy is recommended for                            surveillance. The colonoscopy date will be                            determined after pathology results from today's                            exam become available for review. Gatha Mayer, MD 06/15/2017 8:51:45 AM This report has been signed electronically.

## 2017-06-15 NOTE — Progress Notes (Signed)
Pt's states no medical or surgical changes since previsit or office visit.Patient consents to observer being present for procedure. No soy or egg allergy

## 2017-06-16 ENCOUNTER — Other Ambulatory Visit: Payer: Self-pay | Admitting: Internal Medicine

## 2017-06-16 ENCOUNTER — Telehealth: Payer: Self-pay | Admitting: Internal Medicine

## 2017-06-16 ENCOUNTER — Telehealth: Payer: Self-pay

## 2017-06-16 MED ORDER — MESALAMINE 1.2 G PO TBEC: DELAYED_RELEASE_TABLET | ORAL | 3 refills | Status: DC

## 2017-06-16 NOTE — Telephone Encounter (Signed)
  Follow up Call-  Call back number 06/15/2017  Post procedure Call Back phone  # [phone]  Permission to leave phone message Yes  Some recent data might be hidden     Patient questions:  Do you have a fever, pain , or abdominal swelling? No. Pain Score  0 *  Have you tolerated food without any problems? Yes.    Have you been able to return to your normal activities? Yes.    Do you have any questions about your discharge instructions: Diet   No. Medications  No. Follow up visit  No.  Do you have questions or concerns about your Care? No.  Actions: * If pain score is 4 or above: No action needed, pain <4.

## 2017-06-16 NOTE — Telephone Encounter (Signed)
Patient calling wanting to speak with PJ about medication lialda. States she spoke with her today and has further questions.

## 2017-06-16 NOTE — Telephone Encounter (Signed)
I spoke with patient and she said she is being told the medicine is so expensive that we need to fax her mesalamine to [place]. Is this okay with you Sir?

## 2017-06-16 NOTE — Telephone Encounter (Signed)
  Follow up Call-  Call back number 06/15/2017  Post procedure Call Back phone  # [phone]  Permission to leave phone message Yes  Some recent data might be hidden     Left message

## 2017-06-16 NOTE — Telephone Encounter (Signed)
Yes - rx Lialda as written in med list   3 month supply w/ 3 RF

## 2017-06-16 NOTE — Telephone Encounter (Signed)
Questions answered.

## 2017-06-16 NOTE — Telephone Encounter (Signed)
I have faxed the rx as requested and [facility] has been informed that it has been sent.

## 2017-06-20 ENCOUNTER — Encounter: Payer: Self-pay | Admitting: Internal Medicine

## 2017-06-20 NOTE — Progress Notes (Signed)
UC in remission Recall 2022

## 2017-06-26 ENCOUNTER — Telehealth: Payer: Self-pay | Admitting: Internal Medicine

## 2017-06-26 NOTE — Telephone Encounter (Signed)
Patient calling to see if Dr.Gessner knows anything about medication mezavant. Pt states her insurance wants her to try it and tells her it is a generic lialda from [place].

## 2017-06-26 NOTE — Telephone Encounter (Signed)
I spoke with Beverly Sandoval and told her Dr [name] is back in the office on Wednesday and we will ask him about this. I googled it and it appears to be the same Sir, please advise.

## 2017-06-28 NOTE — Telephone Encounter (Signed)
Same thing different name  So ok

## 2017-06-28 NOTE — Telephone Encounter (Signed)
Left her a detailed message as Dr [name] advised.

## 2017-06-30 ENCOUNTER — Telehealth: Payer: Self-pay | Admitting: Internal Medicine

## 2017-06-30 NOTE — Telephone Encounter (Signed)
Left message for pt to call back  °

## 2017-07-04 NOTE — Telephone Encounter (Signed)
Left message for patient to call back  

## 2017-07-05 NOTE — Telephone Encounter (Signed)
No return call.  Will await a return call from the patient

## 2017-08-07 ENCOUNTER — Telehealth: Payer: Self-pay

## 2017-08-07 ENCOUNTER — Other Ambulatory Visit: Payer: Self-pay | Admitting: Internal Medicine

## 2017-08-07 NOTE — Telephone Encounter (Signed)
Unlikely to cause problems with her colitis so I am ok with her taking it as a preventive agent

## 2017-08-07 NOTE — Telephone Encounter (Signed)
Leith informed.

## 2017-08-07 NOTE — Telephone Encounter (Signed)
Patients PCP Suzy Bouchard would like for her to take an aspirin 81mg  daily. She wanted to check with Dr [name] first to make sure its not going to upset her colitis.  Please advise Sir, thank you.

## 2018-01-23 ENCOUNTER — Other Ambulatory Visit: Payer: Self-pay

## 2018-01-23 MED ORDER — MESALAMINE 1.2 G PO TBEC: DELAYED_RELEASE_TABLET | ORAL | 0 refills | Status: DC

## 2018-01-23 NOTE — Telephone Encounter (Signed)
Rx faxed [place] [address] fax [phone] per faxed request.

## 2018-03-15 ENCOUNTER — Other Ambulatory Visit: Payer: Self-pay | Admitting: Internal Medicine

## 2018-03-15 DIAGNOSIS — Z1231 Encounter for screening mammogram for malignant neoplasm of breast: Secondary | ICD-10-CM

## 2018-03-21 ENCOUNTER — Ambulatory Visit: Payer: BLUE CROSS/BLUE SHIELD

## 2018-04-12 ENCOUNTER — Ambulatory Visit: Payer: BLUE CROSS/BLUE SHIELD

## 2018-05-08 ENCOUNTER — Other Ambulatory Visit: Payer: Self-pay

## 2018-05-08 ENCOUNTER — Emergency Department
Admission: EM | Admit: 2018-05-08 | Discharge: 2018-05-08 | Disposition: A | Discharge status: Home / Self Care | Payer: BLUE CROSS/BLUE SHIELD | Source: Home / Self Care | Attending: Family Medicine | Admitting: Family Medicine

## 2018-05-08 DIAGNOSIS — H8113 Benign paroxysmal vertigo, bilateral: Secondary | ICD-10-CM | POA: Diagnosis not present

## 2018-05-08 DIAGNOSIS — R112 Nausea with vomiting, unspecified: Secondary | ICD-10-CM

## 2018-05-08 LAB — POCT FASTING CBG KUC MANUAL ENTRY: POCT GLUCOSE (MANUAL ENTRY) KUC: 118 mg/dL — AB (ref 70–99)

## 2018-05-08 MED ORDER — ONDANSETRON HCL 4 MG/2ML IJ SOLN
4.0000 mg | Freq: Once | INTRAMUSCULAR | Status: AC
  Administered 2018-05-08: 4 mg via INTRAMUSCULAR

## 2018-05-08 MED ORDER — MECLIZINE HCL 25 MG PO TABS: 25.0000 mg | ORAL_TABLET | Freq: Three times a day (TID) | ORAL | 0 refills | Status: DC | PRN

## 2018-05-08 NOTE — ED Triage Notes (Signed)
Pt started with dizziness after lunch today, has thrown up 6-7 times since lunch.
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
## 2018-05-08 NOTE — Discharge Instructions (Addendum)
Increase fluid intake.  Drink enough fluid to keep your urine clear or pale yellow. If symptoms become significantly worse during the night or over the weekend, proceed to the local emergency room.   To prevent recurrent ear wax blockage, try the following: Soak two cotton balls with mineral oil, and gently place in each ear canal once weekly.  Leave the cotton balls in place for 10 to 20 minutes.  This will help liquefy the ear wax and aid your body's normal elimination process.  If applicable, do not use a hearing aid for 8 hours overnight.  Have your ears cleaned by a health professional every 6 to 12 months.  Avoid using "Q-tips" and ear wax softening solutions

## 2018-05-09 ENCOUNTER — Telehealth: Payer: Self-pay

## 2018-05-09 ENCOUNTER — Telehealth: Payer: Self-pay | Admitting: *Deleted

## 2018-05-09 MED ORDER — ONDANSETRON 4 MG PO TBDP: 4.0000 mg | ORAL_TABLET | Freq: Three times a day (TID) | ORAL | 0 refills | Status: DC | PRN

## 2018-05-09 NOTE — Telephone Encounter (Signed)
Will send in zofran for pt.

## 2018-05-09 NOTE — Telephone Encounter (Signed)
Notified that Zofran was sent to the pharmacy.

## 2018-05-22 ENCOUNTER — Ambulatory Visit
Admission: RE | Admit: 2018-05-22 | Discharge: 2018-05-22 | Disposition: A | Discharge status: Home / Self Care | Payer: BLUE CROSS/BLUE SHIELD | Source: Ambulatory Visit | Attending: Internal Medicine | Admitting: Internal Medicine

## 2018-05-22 DIAGNOSIS — Z1231 Encounter for screening mammogram for malignant neoplasm of breast: Secondary | ICD-10-CM

## 2018-07-04 ENCOUNTER — Other Ambulatory Visit: Payer: Self-pay

## 2018-07-04 MED ORDER — MESALAMINE 1.2 G PO TBEC: DELAYED_RELEASE_TABLET | ORAL | 3 refills | Status: DC

## 2018-07-04 NOTE — Telephone Encounter (Signed)
We received a fax from [facility] in [place], [place], phone # [phone], fax # [phone] requesting a refill on her Mesalamine 1.2g. form hand  Signed by Dr [name] and faxed back to refill this for a year, 2 tablets twice a day.

## 2018-08-01 ENCOUNTER — Other Ambulatory Visit: Payer: Self-pay

## 2018-08-01 ENCOUNTER — Encounter: Payer: Self-pay | Admitting: Emergency Medicine

## 2018-08-01 ENCOUNTER — Emergency Department (INDEPENDENT_AMBULATORY_CARE_PROVIDER_SITE_OTHER): Payer: BLUE CROSS/BLUE SHIELD

## 2018-08-01 ENCOUNTER — Emergency Department
Admission: EM | Admit: 2018-08-01 | Discharge: 2018-08-01 | Disposition: A | Discharge status: Home / Self Care | Payer: BLUE CROSS/BLUE SHIELD | Source: Home / Self Care

## 2018-08-01 DIAGNOSIS — R0781 Pleurodynia: Secondary | ICD-10-CM

## 2018-08-01 MED ORDER — TRAMADOL HCL 50 MG PO TABS: 50.0000 mg | ORAL_TABLET | Freq: Four times a day (QID) | ORAL | 0 refills | Status: DC | PRN

## 2018-08-01 MED ORDER — METHOCARBAMOL 500 MG PO TABS: 500.0000 mg | ORAL_TABLET | Freq: Two times a day (BID) | ORAL | 0 refills | Status: DC

## 2018-08-01 NOTE — Discharge Instructions (Signed)
°  Tramadol is strong pain medication. While taking, do not drink alcohol, drive, or perform any other activities that requires focus while taking these medications.   Robaxin (methocarbamol) is a muscle relaxer and may cause drowsiness. Do not drink alcohol, drive, or operate heavy machinery while taking.   Please follow up with family medicine in 1 week if not improving, sooner if worsening.
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
## 2018-08-08 ENCOUNTER — Telehealth: Payer: Self-pay | Admitting: *Deleted

## 2018-08-08 NOTE — Telephone Encounter (Signed)
Pt called requesting refill of her Tramadol. Per Dr Assunta Found rx was written for Tramadol #12/0RF. Left at the front desk for pickup.

## 2018-08-13 ENCOUNTER — Telehealth: Payer: Self-pay | Admitting: Internal Medicine

## 2018-08-13 NOTE — Telephone Encounter (Signed)
The pt had questions regarding COVID 19 she was advised to follow social distancing, frequent handwashing and avoid going out as much as possible.  The pt has been advised of the information and verbalized understanding.

## 2018-08-13 NOTE — Telephone Encounter (Signed)
Pt called wanting to speak with nurse and ask question about her condition.

## 2019-05-30 ENCOUNTER — Other Ambulatory Visit: Payer: Self-pay | Admitting: Internal Medicine

## 2019-05-30 ENCOUNTER — Telehealth: Payer: Self-pay

## 2019-05-30 DIAGNOSIS — Z1231 Encounter for screening mammogram for malignant neoplasm of breast: Secondary | ICD-10-CM

## 2019-05-30 NOTE — Telephone Encounter (Signed)
I faxed the form in to fax # [phone] for her to get her medicine and called and left her a voice mail that this has been done.

## 2019-05-30 NOTE — Telephone Encounter (Signed)
We received a fax from [facility] in [place] for a script for patient's mezavant 1.2grams. I called her and she said she is still on this even thou it's not on her list. I have made her an appointment to come in as it has been awhile. I will get Dr [name] to sign the form and then I can fax it to [facility].

## 2019-06-05 ENCOUNTER — Other Ambulatory Visit: Payer: Self-pay

## 2019-06-05 ENCOUNTER — Ambulatory Visit (INDEPENDENT_AMBULATORY_CARE_PROVIDER_SITE_OTHER): Payer: BC Managed Care – PPO

## 2019-06-05 DIAGNOSIS — Z1231 Encounter for screening mammogram for malignant neoplasm of breast: Secondary | ICD-10-CM | POA: Diagnosis not present

## 2019-07-04 ENCOUNTER — Encounter: Payer: Self-pay | Admitting: Internal Medicine

## 2019-07-04 ENCOUNTER — Ambulatory Visit: Payer: BC Managed Care – PPO | Admitting: Internal Medicine

## 2019-07-04 ENCOUNTER — Other Ambulatory Visit: Payer: Self-pay

## 2019-07-04 VITALS — BP 118/64 | HR 68 | Temp 98.9°F | Ht 67.0 in | Wt 232.1 lb

## 2019-07-04 DIAGNOSIS — K5100 Ulcerative (chronic) pancolitis without complications: Secondary | ICD-10-CM | POA: Diagnosis not present

## 2019-07-04 DIAGNOSIS — E669 Obesity, unspecified: Secondary | ICD-10-CM | POA: Diagnosis not present

## 2019-07-04 NOTE — Assessment & Plan Note (Signed)
Recommended she try low-carb again, not do intermittent fasting because she is a diabetic, explained how low-fat to control weight and cholesterol hypothesis has been wrong and using fat as a fuel is better than using glucose or sugars as a fuel.  Referred to [facility] website and [email or web address] website  See AVS

## 2019-07-04 NOTE — Assessment & Plan Note (Signed)
Doing well Stay on mesalamine Colonoscopy 2022

## 2019-07-04 NOTE — Progress Notes (Signed)
Beverly Sandoval 59 y.o. August 18, 1960 [id number]  Assessment & Plan:  Ulcerative colitis, universal ([place]) Doing well Stay on mesalamine Colonoscopy 2022  Class 2 obesity Recommended she try low-carb again, not do intermittent fasting because she is a diabetic, explained how low-fat to control weight and cholesterol hypothesis has been wrong and using fat as a fuel is better than using glucose or sugars as a fuel.  Referred to [facility] website and [email or web address] website  See AVS  I appreciate the opportunity to care for this patient. CC: Nicola Girt, DO    Subjective:   Chief Complaint: Ulcerative colitis  HPI Here for follow-up of chronic ulcerative colitis, universal, also history of abnormal transaminases obesity.  On mesalamine obtaining it through [place] doing well no colitis symptoms.  Last colonoscopy colitis in remission 2019 plan to repeat 2022.  Continues to work in childcare.  No issues with Covid.  Had lost a lot of weight by reducing calories and carbohydrates and walking has gained back as below  Wt Readings from Last 3 Encounters:  07/04/19 232 lb 2 oz (105.3 kg)  08/01/18 201 lb (91.2 kg)  05/08/18 196 lb (88.9 kg)    Allergies  Allergen Reactions  . Codeine     REACTION: Flu like symptoms   Current Meds  Medication Sig  . amLODipine (NORVASC) 2.5 MG tablet Take 5 mg by mouth daily.   . Calcium 500-100 MG-UNIT CHEW Chew by mouth.  . cetirizine (ZYRTEC) 10 MG tablet Take 10 mg by mouth daily.    . Cholecalciferol (VITAMIN D-3) 1000 UNITS CAPS Take 3,000 Units by mouth daily.  . Cyanocobalamin (VITAMIN B 12 PO) Take 1,500 mg by mouth daily.  Marland Kitchen glucose blood (ONETOUCH VERIO) test strip Dx: E11.9 - USE ONE STRIP TO CHECK GLUCOSE TWICE DAILY  . hydrochlorothiazide 25 MG tablet Take 25 mg by mouth daily.    Marland Kitchen LORazepam (ATIVAN) 0.5 MG tablet Take 0.5 mg by mouth at bedtime. Takes one fourth of a tablet at bedtime  . MAGNESIUM PO Take by  mouth.  . mesalamine (LIALDA) 1.2 g EC tablet Take 2.4 g by mouth 2 (two) times daily. Patient gets the Medicine from [place] and it comes with the name of [place]  . metFORMIN (GLUCOPHAGE) 500 MG tablet Take 500 mg by mouth 2 (two) times daily with a meal.   . omeprazole (PRILOSEC) 10 MG capsule Take 10 mg by mouth daily.  Marland Kitchen OVER THE COUNTER MEDICATION daily.  . traMADol (ULTRAM) 50 MG tablet Take 1 tablet (50 mg total) by mouth every 6 (six) hours as needed.   Current Facility-Administered Medications for the 07/04/19 encounter (Office Visit) with Gatha Mayer, MD  Medication  . 0.9 %  sodium chloride infusion   Past Medical History:  Diagnosis Date  . Abnormal LFTs   . ADD (attention deficit disorder)   . Allergic rhinitis   . Allergy   . diabetes   . Diabetes mellitus without complication ([place])   . GERD (gastroesophageal reflux disease)   . 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 Headache(784.0)   . History of colonic polyp - adenoma 04/03/2014   03/2014 - diminutive adenoma - repeat colon 2018  . HTN (hypertension)   . Obesity   . Plantar fasciitis   . Pre-diabetes   . Universal ulcerative colitis ([place])    Past Surgical History:  Procedure Laterality Date  . ABLATION SAPHENOUS VEIN W/ RFA Right 2012   leg  . COLONOSCOPY  01/08/2009  Normal, ulcerative colitis in remission(multiple)  . GANGLION CYST EXCISION Left 1995   hand  . POLYPECTOMY     Social History   Social History Narrative   Married 2 children   Works in [employer]   Non-smoker rare alcohol no drug use   family history includes Clotting disorder in her father; Colon polyps in her mother; Diabetes in her mother; Endometrial cancer in her mother; Prostate cancer in her father.   Review of Systems As above  Objective:   Physical Exam BP 118/64   Pulse 68   Temp 98.9 F (37.2 C)   Ht 5' 7"  (1.702 m)   Wt 232 lb 2 oz (105.3 kg)   LMP 02/09/2013   BMI 36.36 kg/m  Lungs cta Cor NL abd  obese, NT

## 2019-07-04 NOTE — Patient Instructions (Signed)
Glad that the colitis is under control.  Here is some nutritional information that I discussed. Please check it out.  Healthy and nutritious eating and weight loss are made to be harder than they need to be. A simplified diet approach based around eating normally, as much as you want without restricting intake too much is better, I think. You must avoid packaged foods and try to eat real foods as much as possible. Packaged foods, sugary sodas, highly processed foods taste great but are slow poisons that lead to obesity and/or poor health.  It is very helpful to take some time each week and plan your meals. Work with your spouse, partner, family to do this as much as possible. Preparing meals ahead to take to work or school is especially helpful and will save money, too. You can do this and by working together it can take less time.  Another way to help is to order food on-line for pick-up (or delivery if you can afford) and you will avoid impulse buys of unhealthy foods.  Some resources that I like are:  www.gaplesinstitute.org (Do the learning modules about healthy eating)  www.dietdoctor.com - helps with low carb diets and also can learn about and consider intermittent  fasting. If you have diabetes would not do intermittent fasting without checking with your doctor. Best to change what you eat before doing this.     I recommend you check your blood sugar daily and keep a log. If you are diabetic and check sugars.  Here is some other advice to help you with meal planning -  Avoid all processed and packaged foods (bread, pasta, crackers, chips, etc) and beverages containing calories.  Avoid added sugars and excessive natural sugars.  Attention to how you feel if you consume artificial sweeteners.  Do they make you more hungry or raise your blood sugar? Best to avoid them in most cases.  I appreciate the opportunity to care for you. Gatha Mayer, MD, [facility]

## 2019-07-22 ENCOUNTER — Telehealth: Payer: Self-pay | Admitting: Internal Medicine

## 2019-07-22 NOTE — Telephone Encounter (Signed)
Patient informed.  Lab results sent to be scanned into epic.

## 2019-07-22 NOTE — Telephone Encounter (Signed)
Patient called and has questions in reference to her medication

## 2019-07-22 NOTE — Telephone Encounter (Signed)
Liver tests are fine No changes recomended

## 2019-07-22 NOTE — Telephone Encounter (Signed)
Patient to fax over lab work to show Dr [name]. She has medicine for a toe fungus and she doesn't want to upset her colitis. She wanted to make sure her liver test looked good to him.

## 2019-07-22 NOTE — Telephone Encounter (Signed)
Lab work put into folder in Dr [employer] office for review.

## 2019-12-12 ENCOUNTER — Emergency Department (INDEPENDENT_AMBULATORY_CARE_PROVIDER_SITE_OTHER)
Admission: EM | Admit: 2019-12-12 | Discharge: 2019-12-12 | Disposition: A | Discharge status: Home / Self Care | Payer: BC Managed Care – PPO | Source: Home / Self Care

## 2019-12-12 ENCOUNTER — Other Ambulatory Visit: Payer: Self-pay

## 2019-12-12 DIAGNOSIS — R519 Headache, unspecified: Secondary | ICD-10-CM

## 2019-12-12 DIAGNOSIS — H8112 Benign paroxysmal vertigo, left ear: Secondary | ICD-10-CM

## 2019-12-12 DIAGNOSIS — R112 Nausea with vomiting, unspecified: Secondary | ICD-10-CM | POA: Diagnosis not present

## 2019-12-12 DIAGNOSIS — H6122 Impacted cerumen, left ear: Secondary | ICD-10-CM

## 2019-12-12 MED ORDER — MECLIZINE HCL 25 MG PO TABS: 25.0000 mg | ORAL_TABLET | Freq: Three times a day (TID) | ORAL | 0 refills | Status: DC | PRN

## 2019-12-12 MED ORDER — ONDANSETRON 4 MG PO TBDP: 4.0000 mg | ORAL_TABLET | Freq: Three times a day (TID) | ORAL | 0 refills | Status: AC | PRN

## 2019-12-12 MED ORDER — ONDANSETRON HCL 4 MG/2ML IJ SOLN
4.0000 mg | Freq: Once | INTRAMUSCULAR | Status: AC
  Administered 2019-12-12: 4 mg via INTRAMUSCULAR

## 2019-12-12 MED ORDER — PREDNISONE 50 MG PO TABS: 50.0000 mg | ORAL_TABLET | Freq: Every day | ORAL | 0 refills | Status: AC

## 2019-12-12 MED ORDER — CARBAMIDE PEROXIDE 6.5 % OT SOLN: 5.0000 [drp] | Freq: Two times a day (BID) | OTIC | 0 refills | Status: DC

## 2019-12-12 NOTE — Discharge Instructions (Signed)
  Please follow up with family medicine if not improving by Monday.  Call 911 or have someone drive you to the hospital if symptoms worsening.

## 2019-12-12 NOTE — ED Provider Notes (Signed)
[facility]    CSN: [id number] Arrival date & time: 12/12/19  1326      History   Chief Complaint Chief Complaint  Patient presents with  . Dizziness  . Nausea    HPI Beverly Sandoval is a 59 y.o. female.   HPI  Beverly Sandoval is a 59 y.o. female presenting to UC with c/o 4 days of dizziness, nausea and vomiting, and mild frontal headache.  Hx of similar symptoms but with more severe vomiting about in 2019, dx as vertigo. She was given an injection of Zofran 21m at that time and meclizine, symptoms quickly resolved. Today, dizziness is worse with rapid head movements or change in body position. No recent URI symptoms. No fever or chills. No pain, weakness or numbness in arms or legs.    Past Medical History:  Diagnosis Date  . Abnormal LFTs   . ADD (attention deficit disorder)   . Allergic rhinitis   . Allergy   . diabetes   . [place] mellitus [place] ([place]   . GERD (gastroesophageal reflux disease)   . 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 Headache(784.0)   . History of colonic polyp - adenoma 04/03/2014   03/2014 - diminutive adenoma - repeat colon 2018  . HTN (hypertension)   . Obesity   . Plantar fasciitis   . Pre-diabetes   . Universal ulcerative colitis ([facility]     Patient Active Problem List   Diagnosis Date Noted  . History of colonic polyp - adenoma 04/03/2014  . Ulcerative colitis, universal ([place] 10/24/2007  . Class 2 obesity 10/23/2007  . HYPERTENSION 10/23/2007  . HEADACHE, CHRONIC 10/23/2007    Past Surgical History:  Procedure Laterality Date  . ABLATION SAPHENOUS VEIN W/ RFA Right 2012   leg  . COLONOSCOPY  01/08/2009   Normal, ulcerative colitis in remission(multiple)  . GANGLION CYST EXCISION Left 1995   hand    OB History   No obstetric history on file.      Home Medications    Prior to Admission medications   Medication Sig Start Date End Date Taking? Authorizing Provider  cetirizine (ZYRTEC) 10 MG tablet  Take 10 mg by mouth daily.     Yes [provider]  amLODipine (NORVASC) 2.5 MG tablet Take 5 mg by mouth daily.  05/09/17   [provider]  Calcium 500-100 MG-UNIT CHEW Chew by mouth.    [provider]  carbamide peroxide (DEBROX) 6.5 % OTIC solution Place 5 drops into the left ear 2 (two) times daily. 12/12/19   PNoe Gens PA-C  Cholecalciferol (VITAMIN D-3) 1000 UNITS CAPS Take 3,000 Units by mouth daily.    [provider]  Cyanocobalamin (VITAMIN B 12 PO) Take 1,500 mg by mouth daily.    [provider]  glucose blood (ONETOUCH VERIO) test strip Dx: E11.9 - USE ONE STRIP TO CHECK GLUCOSE TWICE DAILY 05/11/17   [provider]  hydrochlorothiazide 25 MG tablet Take 25 mg by mouth daily.      [provider]  LORazepam (ATIVAN) 0.5 MG tablet Take 0.5 mg by mouth at bedtime. Takes one fourth of a tablet at bedtime    [provider]  MAGNESIUM PO Take by mouth.    [provider]  meclizine (ANTIVERT) 25 MG tablet Take 1 tablet (25 mg total) by mouth 3 (three) times daily as needed for dizziness. 12/12/19   PNoe Gens PA-C  mesalamine (LIALDA) 1.2 g EC tablet Take 2.4  g by mouth 2 (two) times daily. Patient gets the Medicine from [place] and it comes with the name of [place]    [provider]  metFORMIN (GLUCOPHAGE) 500 MG tablet Take 500 mg by mouth 2 (two) times daily with a meal.     [provider]  omeprazole (PRILOSEC) 10 MG capsule Take 10 mg by mouth daily. 06/01/17   [provider]  ondansetron (ZOFRAN-ODT) 4 MG disintegrating tablet Take 1 tablet (4 mg total) by mouth every 8 (eight) hours as needed for nausea or vomiting. 12/12/19   Noe Gens, PA-C  OVER THE COUNTER MEDICATION daily.    [provider]  predniSONE (DELTASONE) 50 MG tablet Take 1 tablet (50 mg total) by mouth daily with breakfast for 5 days. 12/12/19 12/17/19  Noe Gens, PA-C  traMADol  (ULTRAM) 50 MG tablet Take 1 tablet (50 mg total) by mouth every 6 (six) hours as needed. 08/01/18   Noe Gens, PA-C    Family History Family History  Problem Relation Age of Onset  . Prostate cancer Father   . Clotting disorder Father   . Diabetes Mother        MGM  . Endometrial cancer Mother   . Colon polyps Mother   . Colon cancer Neg Hx   . Rectal cancer Neg Hx   . Stomach cancer Neg Hx     Social History Social History   Tobacco Use  . Smoking status: Never Smoker  . Smokeless tobacco: Never Used  Vaping Use  . Vaping Use: Never used  Substance Use Topics  . Alcohol use: Yes    Alcohol/week: 3.0 standard drinks    Types: 3 Shots of liquor per week    Comment: occasional fuzzy navel at night  . Drug use: No     Allergies   Codeine   Review of Systems Review of Systems  Constitutional: Negative for chills and fever.  HENT: Positive for ear pain ( fullness). Negative for congestion, sore throat, trouble swallowing and voice change.   Respiratory: Negative for cough and shortness of breath.   Cardiovascular: Negative for chest pain and palpitations.  Gastrointestinal: Positive for nausea and vomiting. Negative for abdominal pain and diarrhea.  Musculoskeletal: Negative for arthralgias, back pain, myalgias, neck pain and neck stiffness.  Skin: Negative for rash.  Neurological: Positive for dizziness and headaches (frontal). Negative for syncope, facial asymmetry, weakness, light-headedness and numbness.  All other systems reviewed and are negative.    Physical Exam Triage Vital Signs ED Triage Vitals  Enc Vitals Group     BP 12/12/19 1344 (!) 145/79     Pulse Rate 12/12/19 1344 96     Resp 12/12/19 1344 16     Temp 12/12/19 1344 98.7 F (37.1 C)     Temp Source 12/12/19 1344 Oral     SpO2 12/12/19 1344 99 %     Weight --      Height --      Head Circumference --      Peak Flow --      Pain Score 12/12/19 1341 0     Pain Loc --      Pain Edu?  --      Excl. in [place]? --    No data found.  Updated Vital Signs BP (!) 145/79 (BP Location: Right Arm)   Pulse 96   Temp 98.7 F (37.1 C) (Oral)   Resp 16   LMP 02/09/2013  SpO2 99%   Visual Acuity Right Eye Distance:   Left Eye Distance:   Bilateral Distance:    Right Eye Near:   Left Eye Near:    Bilateral Near:     Physical Exam Vitals and nursing note reviewed.  Constitutional:      Appearance: Normal appearance. She is well-developed.  HENT:     Head: Normocephalic and atraumatic.     Right Ear: Tympanic membrane and ear canal normal.     Left Ear: There is impacted cerumen.     Nose: Nose normal.     Right Sinus: No maxillary sinus tenderness or frontal sinus tenderness.     Left Sinus: No maxillary sinus tenderness or frontal sinus tenderness.     Mouth/Throat:     Lips: Pink.     Mouth: Mucous membranes are moist.     Pharynx: Oropharynx is clear. Uvula midline.  Eyes:     Extraocular Movements: Extraocular movements intact.     Right eye: Nystagmus present.     Left eye: Nystagmus present.     Pupils: Pupils are equal, round, and reactive to light.     Comments: Bilateral horizontal nystagmus   Cardiovascular:     Rate and Rhythm: Normal rate and regular rhythm.  Pulmonary:     Effort: Pulmonary effort is normal. No respiratory distress.     Breath sounds: Normal breath sounds. No stridor. No wheezing, rhonchi or rales.  Musculoskeletal:        General: Normal range of motion.     Cervical back: Normal range of motion.  Skin:    General: Skin is warm and dry.     Capillary Refill: Capillary refill takes less than 2 seconds.  Neurological:     General: No focal deficit present.     Mental Status: She is alert and oriented to person, place, and time.     Cranial Nerves: No cranial nerve deficit.     Sensory: No sensory deficit.     Motor: No weakness.     Coordination: Coordination normal.     Gait: Gait normal.  Psychiatric:        Mood and  Affect: Mood normal.        Behavior: Behavior normal.      UC Treatments / Results  Labs (all labs ordered are listed, but only abnormal results are displayed) Labs Reviewed - No data to display  EKG   Radiology No results found.  Procedures Procedures (including critical care time)  Medications Ordered in UC Medications  ondansetron (ZOFRAN) injection 4 mg (4 mg Intramuscular Given 12/12/19 1409)    Initial Impression / Assessment and Plan / UC Course  I have reviewed the triage vital signs and the nursing notes.  Pertinent labs & imaging results that were available during my care of the patient were reviewed by me and considered in my medical decision making (see chart for details).     Hx and exam c/w BPPV Left ear- cerumen impacted, unable to remove during flushing technique due to worsening dizziness. Will prescribe debrox F/u PCP or ENT as needed Discussed symptoms that warrant emergent care in the ED. AVS given  Final Clinical Impressions(s) / UC Diagnoses   Final diagnoses:  Benign paroxysmal positional vertigo of left ear  Impacted cerumen, left ear  Frontal headache  Nausea and vomiting in adult patient     Discharge Instructions      Please follow up with family medicine if not improving  by Monday.  Call 911 or have someone drive you to the hospital if symptoms worsening.    ED Prescriptions    Medication Sig Dispense Auth. Provider   meclizine (ANTIVERT) 25 MG tablet Take 1 tablet (25 mg total) by mouth 3 (three) times daily as needed for dizziness. 30 tablet Gerarda Fraction, Crysten Kaman O, PA-C   ondansetron (ZOFRAN-ODT) 4 MG disintegrating tablet Take 1 tablet (4 mg total) by mouth every 8 (eight) hours as needed for nausea or vomiting. 12 tablet Gerarda Fraction, Shadee Rathod O, PA-C   predniSONE (DELTASONE) 50 MG tablet Take 1 tablet (50 mg total) by mouth daily with breakfast for 5 days. 5 tablet Gerarda Fraction, Lache Dagher O, PA-C   carbamide peroxide (DEBROX) 6.5 % OTIC solution Place  5 drops into the left ear 2 (two) times daily. 15 mL Noe Gens, PA-C     PDMP not reviewed this encounter.   Noe Gens, [place] 12/12/19 1540

## 2019-12-12 NOTE — ED Triage Notes (Signed)
Patient presents to Urgent Care with complaints of dizziness, nausea, and vomiting since four days ago. Patient reports she has had similar issues in the past, was diagnosed w/ vertigo here but has not had a flare up since.

## 2020-04-10 ENCOUNTER — Other Ambulatory Visit: Payer: Self-pay

## 2020-04-10 MED ORDER — MESALAMINE 1.2 G PO TBEC: 2.4000 g | DELAYED_RELEASE_TABLET | Freq: Two times a day (BID) | ORAL | 3 refills | Status: DC

## 2020-04-10 NOTE — Telephone Encounter (Signed)
[facility] requested refill on patient's lialda be faxed to them. This has been done , rx faxed to [phone].

## 2020-07-15 ENCOUNTER — Other Ambulatory Visit: Payer: Self-pay | Admitting: Internal Medicine

## 2020-07-15 DIAGNOSIS — Z1231 Encounter for screening mammogram for malignant neoplasm of breast: Secondary | ICD-10-CM

## 2020-07-24 ENCOUNTER — Ambulatory Visit: Payer: BC Managed Care – PPO

## 2020-07-24 ENCOUNTER — Ambulatory Visit (INDEPENDENT_AMBULATORY_CARE_PROVIDER_SITE_OTHER): Payer: BC Managed Care – PPO

## 2020-07-24 ENCOUNTER — Other Ambulatory Visit: Payer: Self-pay

## 2020-07-24 ENCOUNTER — Encounter (INDEPENDENT_AMBULATORY_CARE_PROVIDER_SITE_OTHER): Payer: Self-pay

## 2020-07-24 DIAGNOSIS — Z1231 Encounter for screening mammogram for malignant neoplasm of breast: Secondary | ICD-10-CM | POA: Diagnosis not present

## 2020-07-27 NOTE — Telephone Encounter (Signed)
Pt is wanting to inform the nurse that the labs are being sent to the office for Dr [name] to review

## 2020-09-11 ENCOUNTER — Other Ambulatory Visit: Payer: Self-pay

## 2020-09-11 MED ORDER — MESALAMINE 1.2 G PO TBEC: 2.4000 g | DELAYED_RELEASE_TABLET | Freq: Two times a day (BID) | ORAL | 3 refills | Status: DC

## 2020-09-11 NOTE — Telephone Encounter (Signed)
I called Beverly Sandoval and told her we have faxed her rx for Lialda to [facility] in [place], [place], fax # [phone]. She asked when her colon recall was- January 2022. She was driving so she will call back and set up a pre-visit and colonoscopy.

## 2020-09-14 ENCOUNTER — Telehealth: Payer: Self-pay | Admitting: Internal Medicine

## 2020-09-14 NOTE — Telephone Encounter (Signed)
Inbound call from patient about colonoscopy. Tried scheduling for her but there is no open morning appointments, only afternoon. Explain to patient that the next morning appointment will be in August. Patient stated she would like a call back to make sure waiting would be okay since she is over due.

## 2020-09-14 NOTE — Telephone Encounter (Signed)
Patient notified that We are looking to add more [facility] time and we will call her with an appointment date and time soon.

## 2020-12-09 ENCOUNTER — Ambulatory Visit (AMBULATORY_SURGERY_CENTER): Payer: BC Managed Care – PPO | Admitting: *Deleted

## 2020-12-09 ENCOUNTER — Other Ambulatory Visit: Payer: Self-pay

## 2020-12-09 VITALS — Ht 67.0 in | Wt 229.0 lb

## 2020-12-09 DIAGNOSIS — K5100 Ulcerative (chronic) pancolitis without complications: Secondary | ICD-10-CM

## 2020-12-09 DIAGNOSIS — Z86010 Personal history of colonic polyps: Secondary | ICD-10-CM

## 2020-12-09 NOTE — Progress Notes (Signed)
Patient's pre-visit was done today over the phone with the patient due to COVID-19 pandemic. Name,DOB and address verified. Insurance verified. Patient denies any allergies to Eggs and Soy. Patient denies any problems with anesthesia/sedation. Patient denies taking diet pills or blood thinners. No home Oxygen. Packet of Prep instructions mailed to patient including a copy of a consent form-pt is aware. Patient understands to call us back with any questions or concerns. Patient is aware of our care-partner policy and OILNZ-97 safety protocol.   EMMI education assigned to the patient for the procedure, sent to [place].   The patient is COVID-19 vaccinated, per patient.

## 2020-12-23 ENCOUNTER — Other Ambulatory Visit: Payer: Self-pay

## 2020-12-23 ENCOUNTER — Ambulatory Visit (AMBULATORY_SURGERY_CENTER): Payer: BC Managed Care – PPO | Admitting: Internal Medicine

## 2020-12-23 ENCOUNTER — Encounter: Payer: Self-pay | Admitting: Internal Medicine

## 2020-12-23 VITALS — BP 113/75 | HR 73 | Temp 98.6°F | Resp 17 | Ht 67.0 in | Wt 229.0 lb

## 2020-12-23 DIAGNOSIS — K5100 Ulcerative (chronic) pancolitis without complications: Secondary | ICD-10-CM

## 2020-12-23 DIAGNOSIS — K529 Noninfective gastroenteritis and colitis, unspecified: Secondary | ICD-10-CM | POA: Diagnosis not present

## 2020-12-23 DIAGNOSIS — D125 Benign neoplasm of sigmoid colon: Secondary | ICD-10-CM

## 2020-12-23 DIAGNOSIS — Z86010 Personal history of colonic polyps: Secondary | ICD-10-CM | POA: Diagnosis present

## 2020-12-23 DIAGNOSIS — K621 Rectal polyp: Secondary | ICD-10-CM

## 2020-12-23 MED ORDER — SODIUM CHLORIDE 0.9 % IV SOLN: 500.0000 mL | Freq: Once | INTRAVENOUS | Status: DC

## 2020-12-23 NOTE — Progress Notes (Signed)
Report to PACU, RN, vss, BBS= Clear.  

## 2020-12-23 NOTE — Patient Instructions (Addendum)
I did see and removed one tiny polyp that looks benign.  All else ok - no inflammation.  I will let you know pathology results and when to have another routine colonoscopy by mail and/or My Chart.  Have a great trip to [place]!  I appreciate the opportunity to care for you. Gatha Mayer, MD, [facility]   Resume previous medications.  Await pathology for final recommendations.  Handouts on findings given to patient.     YOU HAD AN ENDOSCOPIC PROCEDURE TODAY AT [facility]:   Refer to the procedure report that was given to you for any specific questions about what was found during the examination.  If the procedure report does not answer your questions, please call your gastroenterologist to clarify.  If you requested that your care partner not be given the details of your procedure findings, then the procedure report has been included in a sealed envelope for you to review at your convenience later.  YOU SHOULD EXPECT: Some feelings of bloating in the abdomen. Passage of more gas than usual.  Walking can help get rid of the air that was put into your GI tract during the procedure and reduce the bloating. If you had a lower endoscopy (such as a colonoscopy or flexible sigmoidoscopy) you may notice spotting of blood in your stool or on the toilet paper. If you underwent a bowel prep for your procedure, you may not have a normal bowel movement for a few days.  Please Note:  You might notice some irritation and congestion in your nose or some drainage.  This is from the oxygen used during your procedure.  There is no need for concern and it should clear up in a day or so.  SYMPTOMS TO REPORT IMMEDIATELY:  Following lower endoscopy (colonoscopy or flexible sigmoidoscopy):  Excessive amounts of blood in the stool  Significant tenderness or worsening of abdominal pains  Swelling of the abdomen that is new, acute  Fever of 100F or higher  For urgent or emergent issues, a  gastroenterologist can be reached at any hour by calling [phone]. Do not use MyChart messaging for urgent concerns.    DIET:  We do recommend a small meal at first, but then you may proceed to your regular diet.  Drink plenty of fluids but you should avoid alcoholic beverages for 24 hours.  ACTIVITY:  You should plan to take it easy for the rest of today and you should NOT DRIVE or use heavy machinery until tomorrow (because of the sedation medicines used during the test).    FOLLOW UP: Our staff will call the number listed on your records 48-72 hours following your procedure to check on you and address any questions or concerns that you may have regarding the information given to you following your procedure. If we do not reach you, we will leave a message.  We will attempt to reach you two times.  During this call, we will ask if you have developed any symptoms of COVID 19. If you develop any symptoms (ie: fever, flu-like symptoms, shortness of breath, cough etc.) before then, please call [phone].  If you test positive for Covid 19 in the 2 weeks post procedure, please call and report this information to [place].    If any biopsies were taken you will be contacted by phone or by letter within the next 1-3 weeks.  Please call us at [phone] if you have not heard about the biopsies in 3  weeks.    SIGNATURES/CONFIDENTIALITY: You and/or your care partner have signed paperwork which will be entered into your electronic medical record.  These signatures attest to the fact that that the information above on your After Visit Summary has been reviewed and is understood.  Full responsibility of the confidentiality of this discharge information lies with you and/or your care-partner.

## 2020-12-23 NOTE — Progress Notes (Signed)
Medical history reviewed with no changes noted. VS assessed by C.W 

## 2020-12-23 NOTE — Op Note (Signed)
[facility] Patient Name: Beverly Sandoval Procedure Date: 12/23/2020 9:36 AM MRN: [id number] Endoscopist: Gatha Mayer , MD Age: 60 Referring MD:  Date of Birth: 01/19/1961 Gender: Female Account #: [id number] Procedure:                Colonoscopy Indications:              High risk colon cancer surveillance: Ulcerative                            pancolitis of 8 (or more) years duration Medicines:                Propofol per Anesthesia, Monitored Anesthesia Care Procedure:                Pre-Anesthesia Assessment:                           - Prior to the procedure, a History and Physical                            was performed, and patient medications and                            allergies were reviewed. The patient's tolerance of                            previous anesthesia was also reviewed. The risks                            and benefits of the procedure and the sedation                            options and risks were discussed with the patient.                            All questions were answered, and informed consent                            was obtained. Prior Anticoagulants: The patient has                            taken no previous anticoagulant or antiplatelet                            agents. ASA Grade Assessment: II - A patient with                            mild systemic disease. After reviewing the risks                            and benefits, the patient was deemed in                            satisfactory condition to undergo the procedure.  After obtaining informed consent, the colonoscope                            was passed under direct vision. Throughout the                            procedure, the patient's blood pressure, pulse, and                            oxygen saturations were monitored continuously. The                            Olympus CF-HQ190L (Serial# [id number]) Colonoscope was                             introduced through the anus and advanced to the the                            terminal ileum, with identification of the                            appendiceal orifice and IC valve. The colonoscopy                            was performed without difficulty. The patient                            tolerated the procedure well. The quality of the                            bowel preparation was good. The terminal ileum,                            ileocecal valve, appendiceal orifice, and rectum                            were photographed. The bowel preparation used was                            Miralax via split dose instruction. Scope In: 6:55:37 AM Scope Out: 10:04:23 AM Scope Withdrawal Time: 0 hours 16 minutes 8 seconds  Total Procedure Duration: 0 hours 18 minutes 14 seconds  Findings:                 The perianal and digital rectal examinations were                            normal.                           The terminal ileum appeared normal.                           A diminutive polyp was found in the sigmoid colon.  The polyp was sessile. The polyp was removed with a                            cold snare. Resection and retrieval were complete.                            Verification of patient identification for the                            specimen was done.                           The colon (entire examined portion) appeared                            normal. Two biopsies were taken every 10 cm with a                            cold forceps from the entire colon for ulcerative                            colitis surveillance. These biopsy specimens from                            the cecum/ascend + transverse + desc/prox sig +                            distral sig/rectum were sent to Pathology.                            Verification of patient identification for the                            specimen was done. Estimated blood loss was  minimal.                           No additional abnormalities were found on                            retroflexion. Complications:            No immediate complications. Estimated Blood Loss:     Estimated blood loss was minimal. Impression:               - The examined portion of the ileum was normal.                           - One diminutive polyp in the sigmoid colon,                            removed with a cold snare. Resected and retrieved.                           - The entire examined colon is normal. Biopsied.                           -  Personal history of colonic polyps. Recommendation:           - Patient has a contact number available for                            emergencies. The signs and symptoms of potential                            delayed complications were discussed with the                            patient. Return to normal activities tomorrow.                            Written discharge instructions were provided to the                            patient.                           - Resume previous diet.                           - Continue present medications.                           - Repeat colonoscopy is recommended for                            surveillance. The colonoscopy date will be                            determined after pathology results from today's                            exam become available for review. Gatha Mayer, MD 12/23/2020 10:16:14 AM This report has been signed electronically.

## 2020-12-23 NOTE — Progress Notes (Signed)
Called to room to assist during endoscopic procedure.  Patient ID and intended procedure confirmed with present staff. Received instructions for my participation in the procedure from the performing physician.  

## 2020-12-25 ENCOUNTER — Telehealth: Payer: Self-pay

## 2020-12-25 NOTE — Telephone Encounter (Signed)
  Follow up Call-  Call back number 12/23/2020  Post procedure Call Back phone  # [phone]  Permission to leave phone message Yes  Some recent data might be hidden     Patient questions:  Do you have a fever, pain , or abdominal swelling? No. Pain Score  0 *  Have you tolerated food without any problems? Yes.    Have you been able to return to your normal activities? Yes.    Do you have any questions about your discharge instructions: Diet   No. Medications  No. Follow up visit  No.  Do you have questions or concerns about your Care? No.  Actions: * If pain score is 4 or above: No action needed, pain <4.

## 2021-01-09 ENCOUNTER — Encounter: Payer: Self-pay | Admitting: Internal Medicine

## 2021-01-22 ENCOUNTER — Telehealth: Payer: Self-pay | Admitting: Internal Medicine

## 2021-01-22 NOTE — Telephone Encounter (Signed)
Inbound call from patient requesting a call from a nurse with her pathology results please.

## 2021-01-22 NOTE — Telephone Encounter (Signed)
Spoke with patient, advised that letter was sent with pathology results. Read pathology letter to patient. Advised patient to call us if anything changes prior to her 1 year follow up. She is aware that she will receive a reminder letter closer to that time. Pt verbalized understanding of all information and had no concerns at the end of the call.  A copy of the letter was mailed today.

## 2021-01-26 ENCOUNTER — Telehealth: Payer: Self-pay | Admitting: Internal Medicine

## 2021-01-26 NOTE — Telephone Encounter (Signed)
Patient advised okay to take ibuprofen on a limited basis for HA due to COVID.

## 2021-01-26 NOTE — Telephone Encounter (Signed)
Pt has covid and would like to know what medication she can take for headache besides Tylenol given her diverticulitis. She stated that Tylenol is not working.

## 2021-04-20 ENCOUNTER — Telehealth: Payer: Self-pay | Admitting: Internal Medicine

## 2021-04-20 NOTE — Telephone Encounter (Signed)
Pt notified pt of Dr. [name] advise and Recommendations. Pt verbalized understanding with all questions answered

## 2021-04-20 NOTE — Telephone Encounter (Signed)
I will defer to the treating physician with respect to the treatment for this rash.  I do not think I have used steroids for her in a long time.  We do try to minimize the use but sometimes it is the best thing to treat a problem.

## 2021-04-20 NOTE — Telephone Encounter (Signed)
Please see note below and advise  

## 2021-04-20 NOTE — Telephone Encounter (Signed)
Patient called stating she has patches on her chest and neck and her doctor wants her to use the tapering oral steroid.  As she also takes this when she has a colitis flare-up, she wants to make sure she's not taking it too often or if it's OK to go ahead and take it for the patches.  Please call and advise.  Thank you.

## 2021-07-08 ENCOUNTER — Other Ambulatory Visit: Payer: Self-pay | Admitting: Internal Medicine

## 2021-07-08 DIAGNOSIS — Z1231 Encounter for screening mammogram for malignant neoplasm of breast: Secondary | ICD-10-CM

## 2021-07-17 ENCOUNTER — Other Ambulatory Visit: Payer: Self-pay

## 2021-07-17 ENCOUNTER — Emergency Department
Admission: EM | Admit: 2021-07-17 | Discharge: 2021-07-17 | Disposition: A | Discharge status: Home / Self Care | Payer: BC Managed Care – PPO | Source: Home / Self Care

## 2021-07-17 DIAGNOSIS — J0100 Acute maxillary sinusitis, unspecified: Secondary | ICD-10-CM | POA: Diagnosis not present

## 2021-07-17 LAB — POCT RAPID STREP A (OFFICE): Rapid Strep A Screen: NEGATIVE

## 2021-07-17 MED ORDER — AMOXICILLIN-POT CLAVULANATE 875-125 MG PO TABS: 1.0000 | ORAL_TABLET | Freq: Two times a day (BID) | ORAL | 0 refills | Status: AC

## 2021-07-17 NOTE — ED Provider Notes (Signed)
[facility]    CSN: [id number] Arrival date & time: 07/17/21  0847      History   Chief Complaint Chief Complaint  Patient presents with   Sore Throat   Generalized Body Aches   Fatigue    HPI Beverly Sandoval is a 61 y.o. female.   HPI 61 year old female presents with sore throat, fever (intermittent low-grade), fatigue, and myalgias for 10 days.  PMH significant for HTN and T2DM.  Patient reports is prone for strep pharyngitis.  Past Medical History:  Diagnosis Date   Abnormal LFTs    ADD (attention deficit disorder)    Allergic rhinitis    Allergy    diabetes    Diabetes mellitus without complication (HCC)    GERD (gastroesophageal reflux disease)    Gilbert's syndrome 02/21/2011   Headache(784.0)    History of colonic polyp - adenoma 04/03/2014   03/2014 - diminutive adenoma - repeat colon 2018   HTN (hypertension)    Obesity    Plantar fasciitis    Pre-diabetes    Universal ulcerative colitis ([place])     Patient Active Problem List   Diagnosis Date Noted   History of colonic polyp - adenoma 04/03/2014   Ulcerative colitis, universal ([place]) 10/24/2007   Class 2 obesity 10/23/2007   HYPERTENSION 10/23/2007   HEADACHE, CHRONIC 10/23/2007    Past Surgical History:  Procedure Laterality Date   ABLATION SAPHENOUS VEIN W/ RFA Right 2012   leg   COLONOSCOPY  01/08/2009   Normal, ulcerative colitis in remission(multiple)   COLONOSCOPY  06/15/2017   [facility]   GANGLION CYST EXCISION Left 1995   hand    OB History   No obstetric history on file.      Home Medications    Prior to Admission medications   Medication Sig Start Date End Date Taking? Authorizing Provider  amoxicillin-clavulanate (AUGMENTIN) 875-125 MG tablet Take 1 tablet by mouth every 12 (twelve) hours for 10 days. 07/17/21 07/27/21 Yes Eliezer Lofts, FNP  amLODipine (NORVASC) 5 MG tablet Take 5 mg by mouth daily. 09/15/20   [provider]  busPIRone (BUSPAR) 7.5 MG  tablet Take 7.5 mg by mouth 2 (two) times daily. 07/04/21   [provider]  cetirizine (ZYRTEC) 10 MG tablet Take 10 mg by mouth daily.      [provider]  Cholecalciferol (VITAMIN D-3) 1000 UNITS CAPS Take 3,000 Units by mouth daily.    [provider]  Cyanocobalamin (VITAMIN B 12 PO) Take 1,500 mg by mouth daily.    [provider]  ezetimibe (ZETIA) 10 MG tablet Take 10 mg by mouth daily. 11/15/20   [provider]  glucose blood test strip Dx: E11.9 - USE ONE STRIP TO CHECK GLUCOSE TWICE DAILY 05/11/17   [provider]  hydrochlorothiazide 25 MG tablet Take 25 mg by mouth daily.      [provider]  LORazepam (ATIVAN) 0.5 MG tablet Take 0.5 mg by mouth at bedtime. Takes one fourth of a tablet at bedtime    [provider]  LORazepam (ATIVAN) 1 MG tablet Take 0.5 mg by mouth at bedtime as needed. 09/21/20   [provider]  MAGNESIUM PO Take by mouth.    [provider]  mesalamine (LIALDA) 1.2 g EC tablet Take 2 tablets (2.4 g total) by mouth 2 (two) times daily. Rx faxed to [facility] fax # [phone] 09/11/20   Gatha Mayer, MD  metFORMIN (GLUCOPHAGE) 1000 MG tablet Take  1,000 mg by mouth 2 (two) times daily. 10/26/20   [provider]  omeprazole (PRILOSEC) 20 MG capsule Take 20 mg by mouth daily. 11/16/20   [provider]  ondansetron (ZOFRAN-ODT) 4 MG disintegrating tablet Take 1 tablet (4 mg total) by mouth every 8 (eight) hours as needed for nausea or vomiting. Patient not taking: Reported on 12/23/2020 12/12/19   Noe Gens, PA-C  OZEMPIC, 0.25 OR 0.5 MG/DOSE, 2 MG/1.5ML SOPN SMARTSIG:0.4 Milliliter(s) SUB-Q Once a Week 07/07/21   [provider]    Family History Family History  Problem Relation Age of Onset   Diabetes Mother        MGM   Endometrial cancer Mother    Colon polyps Mother    Prostate cancer Father    Clotting disorder Father    Stomach  cancer Paternal Grandfather    Colon cancer Neg Hx    Rectal cancer Neg Hx    Esophageal cancer Neg Hx     Social History Social History   Tobacco Use   Smoking status: Never   Smokeless tobacco: Never  Vaping Use   Vaping Use: Never used  Substance Use Topics   Alcohol use: Not Currently    Alcohol/week: 1.0 standard drink    Types: 1 Standard drinks or equivalent per week   Drug use: No     Allergies   Codeine   Review of Systems Review of Systems  Constitutional:  Positive for fatigue and fever.  HENT:  Positive for sore throat.   Musculoskeletal:  Positive for myalgias.  All other systems reviewed and are negative.   Physical Exam Triage Vital Signs ED Triage Vitals  Enc Vitals Group     BP      Pulse      Resp      Temp      Temp src      SpO2      Weight      Height      Head Circumference      Peak Flow      Pain Score      Pain Loc      Pain Edu?      Excl. in [place]?    No data found.  Updated Vital Signs BP 130/85 (BP Location: Right Arm)    Pulse 96    Temp 97.9 F (36.6 C) (Oral)    Resp 20    Ht 5\' 7"  (1.702 m)    Wt 218 lb (98.9 kg)    LMP 02/09/2013    SpO2 99%    BMI 34.14 kg/m      Physical Exam Vitals and nursing note reviewed.  Constitutional:      General: She is not in acute distress.    Appearance: Normal appearance. She is obese. She is not ill-appearing.  HENT:     Head: Normocephalic and atraumatic.     Right Ear: Tympanic membrane, ear canal and external ear normal.     Left Ear: Tympanic membrane, ear canal and external ear normal.     Mouth/Throat:     Mouth: Mucous membranes are moist.     Pharynx: Oropharynx is clear.  Eyes:     Extraocular Movements: Extraocular movements intact.     Conjunctiva/sclera: Conjunctivae normal.     Pupils: Pupils are equal, round, and reactive to light.  Cardiovascular:     Rate and Rhythm: Normal rate and regular rhythm.     Pulses: Normal  pulses.     Heart sounds: Normal heart  sounds.  Pulmonary:     Effort: Pulmonary effort is normal.     Breath sounds: Normal breath sounds.  Musculoskeletal:     Cervical back: Normal range of motion and neck supple. No tenderness.  Lymphadenopathy:     Cervical: No cervical adenopathy.  Skin:    General: Skin is warm and dry.  Neurological:     General: No focal deficit present.     Mental Status: She is alert and oriented to person, place, and time. Mental status is at baseline.     UC Treatments / Results  Labs (all labs ordered are listed, but only abnormal results are displayed) Labs Reviewed  POCT RAPID STREP A (OFFICE)    EKG   Radiology No results found.  Procedures Procedures (including critical care time)  Medications Ordered in UC Medications - No data to display  Initial Impression / Assessment and Plan / UC Course  I have reviewed the triage vital signs and the nursing notes.  Pertinent labs & imaging results that were available during my care of the patient were reviewed by me and considered in my medical decision making (see chart for details).     MDM: 1.  Acute maxillary sinusitis, recurrence not specified-Rx'd Augmentin. Advised patient to take medication as directed with food to completion.  Encouraged patient to increase daily water intake while taking this medication.  Patient discharged home, hemodynamically stable. Final Clinical Impressions(s) / UC Diagnoses   Final diagnoses:  Acute maxillary sinusitis, recurrence not specified     Discharge Instructions      Advised patient to take medication as directed with food to completion.  Encouraged patient to increase daily water intake while taking this medication.     ED Prescriptions     Medication Sig Dispense Auth. Provider   amoxicillin-clavulanate (AUGMENTIN) 875-125 MG tablet Take 1 tablet by mouth every 12 (twelve) hours for 10 days. 20 tablet Eliezer Lofts, FNP      PDMP not reviewed this encounter.   Eliezer Lofts, Hand 07/17/21 [id number]

## 2021-07-17 NOTE — ED Triage Notes (Signed)
Pt presents to Urgent Care with c/o sore throat, body aches, and fatigue x 1.5 weeks. Reports intermittent low-grade fever. Afebrile today. Negative home COVID test yesterday.

## 2021-07-17 NOTE — Discharge Instructions (Addendum)
Advised patient to take medication as directed with food to completion.  Encouraged patient to increase daily water intake while taking this medication. 

## 2021-07-28 ENCOUNTER — Ambulatory Visit (INDEPENDENT_AMBULATORY_CARE_PROVIDER_SITE_OTHER): Payer: BC Managed Care – PPO

## 2021-07-28 ENCOUNTER — Other Ambulatory Visit: Payer: Self-pay

## 2021-07-28 DIAGNOSIS — Z1231 Encounter for screening mammogram for malignant neoplasm of breast: Secondary | ICD-10-CM | POA: Diagnosis not present

## 2021-11-05 ENCOUNTER — Telehealth: Payer: Self-pay | Admitting: Internal Medicine

## 2021-11-05 NOTE — Telephone Encounter (Signed)
I have tried to call her back multiple times and the only # she has rings busy busy like something is wrong with the line. I was unable to leave a message. I need to confirm what medicine she needs and which pharmacy she wants it to go to.

## 2021-11-05 NOTE — Telephone Encounter (Signed)
Inbound call from pharmacy for medication refill for Mezavant. Please give them a call back.

## 2021-11-08 MED ORDER — MESALAMINE 1.2 G PO TBEC: 2.4000 g | DELAYED_RELEASE_TABLET | Freq: Two times a day (BID) | ORAL | 0 refills | Status: DC

## 2021-11-08 NOTE — Telephone Encounter (Signed)
I was able to reach Kalley today and told her I was unable to reach her Friday. I confirmed the pharmacy for her mesalamine to go to. It has been faxed to [facility] at fax # [phone] per her request.

## 2021-12-17 ENCOUNTER — Encounter: Payer: Self-pay | Admitting: Internal Medicine

## 2022-02-01 ENCOUNTER — Telehealth: Payer: Self-pay | Admitting: Internal Medicine

## 2022-02-01 NOTE — Telephone Encounter (Signed)
Inbound call from patient stating that she is needing a refill for Lialda 1.2. patient stated that the mediation comes from the [place] and the [place] is telling her that it is okay to take the XL. Patient is requesting a call back to discuss if Dr. [name] is okay with that. Please advise.

## 2022-02-02 NOTE — Telephone Encounter (Signed)
Pt states that her prescription for the Mesalamine (Lialda 1.2g) got changed: Pt stated that her prescription was sent in correctly from our office to the pharmacy: Pt states that [facility] called pt and notified her that instead of her getting her prescription from [place] where she usually gets the medication sent from that she will be getting the medication from the [place]. Pt stated that the medication stated that its 1200 mg Extended Release with a slight different name than Mesalamine: Pt stated that she was at work at did not have pill bottle in front of her: Pt wanted to ensure that Dr. [name] thinks its safe and just as effective for her to take this:  Please advise

## 2022-02-03 ENCOUNTER — Telehealth: Payer: Self-pay

## 2022-02-03 NOTE — Telephone Encounter (Signed)
Pt stated that it is Mezavant xl 1200 mg

## 2022-02-03 NOTE — Telephone Encounter (Signed)
It is probably fine but I need to know exact information about the medication (name, dose)

## 2022-02-03 NOTE — Telephone Encounter (Signed)
It is the same thing she was on different name  OK

## 2022-02-03 NOTE — Telephone Encounter (Signed)
Pt made aware of Dr. [name] recommendations: Pt verbalized understanding with all questions answered.

## 2022-02-03 NOTE — Telephone Encounter (Signed)
Patient returned your call. Requesting a call back. Please call to advise.

## 2022-02-03 NOTE — Telephone Encounter (Signed)
Left message for pt to call back  °

## 2022-04-07 ENCOUNTER — Ambulatory Visit (INDEPENDENT_AMBULATORY_CARE_PROVIDER_SITE_OTHER): Payer: BC Managed Care – PPO | Admitting: Internal Medicine

## 2022-04-07 ENCOUNTER — Encounter: Payer: Self-pay | Admitting: Internal Medicine

## 2022-04-07 VITALS — BP 118/82 | HR 88 | Ht 67.5 in | Wt 228.0 lb

## 2022-04-07 DIAGNOSIS — K5100 Ulcerative (chronic) pancolitis without complications: Secondary | ICD-10-CM

## 2022-04-07 DIAGNOSIS — Z86010 Personal history of colonic polyps: Secondary | ICD-10-CM | POA: Diagnosis not present

## 2022-04-07 DIAGNOSIS — K760 Fatty (change of) liver, not elsewhere classified: Secondary | ICD-10-CM | POA: Diagnosis not present

## 2022-04-07 MED ORDER — MESALAMINE 1.2 G PO TBEC: 2.4000 g | DELAYED_RELEASE_TABLET | Freq: Two times a day (BID) | ORAL | 3 refills | Status: DC

## 2022-04-07 NOTE — Progress Notes (Signed)
Beverly Sandoval 61 y.o. [birth year] [id number]  Assessment & Plan:   Encounter Diagnoses  Name Primary?   Universal ulcerative colitis without complication (HCC) Yes   Personal history of colonic polyps    NAFLD (nonalcoholic fatty liver disease)    She is doing well overall.  We will continue her mesalamine 2.4 g twice daily and return to clinic in 1 year.  Anticipate a routine colonoscopy in 2025 transaminases normal, has no NAFLD.  Platelets normal.  No signs of deterioration.    Subjective:  GI summary:  Universal ulcerative colitis diagnosed 2005.  Has been maintained on different mesalamine therapies over the years currently on Lialda generic 2.4 g twice daily.  Last colonoscopy endoscopic remission 01/09/2021, focal active colitis and cecal/a sending biopsies.  NAFLD fluctuating transaminases at times, fatty liver seen on ultrasound imaging  Chief Complaint: Follow-up ulcerative colitis  HPI 61 year old white woman with a history of universal ulcerative colitis and adenomatous colon polyps, here for routine follow-up reporting that she is doing well.  She also has a history of abnormal transaminases that fluctuate and nonalcoholic fatty liver disease.  She is not having any chronic diarrhea symptoms or rectal bleeding.  Rare episodes of diarrhea to dietary indiscretion perhaps but nothing significant she reports.  Quality of life is good.  She had lab testing through primary care, CMET with normal bili alk phos AST and ALT on March 09, 2022.  CBC in May of this year normal.  Hemoglobin A1c 5.7%.  Last colonoscopy August 20 22-1 diminutive adenoma, looked normal there was just microscopic colitis changes consistent with slight inflammation in the cecal ascending colon area. Wt Readings from Last 3 Encounters:  04/07/22 228 lb (103.4 kg)  07/17/21 218 lb (98.9 kg)  12/23/20 229 lb (103.9 kg)    Allergies  Allergen Reactions   Codeine     REACTION: Flu like symptoms    Current Meds  Medication Sig   amLODipine (NORVASC) 5 MG tablet Take 5 mg by mouth daily.   busPIRone (BUSPAR) 7.5 MG tablet Take 7.5 mg by mouth 2 (two) times daily.   Cholecalciferol (VITAMIN D-3) 1000 UNITS CAPS Take 3,000 Units by mouth daily.   Cyanocobalamin (VITAMIN B 12 PO) Take 1,500 mg by mouth daily.   glucose blood test strip Dx: E11.9 - USE ONE STRIP TO CHECK GLUCOSE TWICE DAILY   hydrochlorothiazide 25 MG tablet Take 25 mg by mouth daily.     LORazepam (ATIVAN) 1 MG tablet Take 0.5 mg by mouth at bedtime as needed.   MAGNESIUM PO Take by mouth.   metFORMIN (GLUCOPHAGE) 1000 MG tablet Take 1,000 mg by mouth 2 (two) times daily.   omeprazole (PRILOSEC) 20 MG capsule Take 20 mg by mouth daily.   ondansetron (ZOFRAN-ODT) 4 MG disintegrating tablet Take 1 tablet (4 mg total) by mouth every 8 (eight) hours as needed for nausea or vomiting.   OZEMPIC, 0.25 OR 0.5 MG/DOSE, 2 MG/1.5ML SOPN SMARTSIG:0.4 Milliliter(s) SUB-Q Once a Week   [DISCONTINUED] mesalamine (LIALDA) 1.2 g EC tablet Take 2 tablets (2.4 g total) by mouth 2 (two) times daily. Rx faxed to [facility] fax # [phone]   Past Medical History:  Diagnosis Date   Abnormal LFTs    ADD (attention deficit disorder)    Allergic rhinitis    Allergy    diabetes    Diabetes mellitus without complication (HCC)    GERD (gastroesophageal reflux disease)    Gilbert's syndrome 02/21/2011   Headache(784.0)  History of colonic polyp - adenoma 04/03/2014   03/2014 - diminutive adenoma - repeat colon 2018   HTN (hypertension)    Obesity    Plantar fasciitis    Pre-diabetes    Universal ulcerative colitis (Beverly Sandoval)    Past Surgical History:  Procedure Laterality Date   ABLATION SAPHENOUS VEIN W/ RFA Right 2012   leg   COLONOSCOPY  01/08/2009   Normal, ulcerative colitis in remission(multiple)   COLONOSCOPY  06/15/2017   Beverly Sandoval   GANGLION CYST EXCISION Left 1995   hand   Social History   Social History  Narrative   Married 2 children   Works in [employer]   Non-smoker rare alcohol no drug use   family history includes Clotting disorder in her father; Colon polyps in her mother; Diabetes in her mother; Endometrial cancer in her mother; Prostate cancer in her father; Stomach cancer in her paternal grandfather.   Review of Systems As above  Objective:   Physical Exam _0  118/82   Pulse 88   Ht 5' 7.5" (1.715 m)   Wt 228 lb (103.4 kg)   LMP 02/09/2013   BMI 35.18 kg/m @  General:  NAD Eyes:   anicteric Lungs:  clear Heart::  S1S2 no rubs, murmurs or gallops Abdomen:  soft and nontender, BS+ Ext:   no edema, cyanosis or clubbing    Data Reviewed:  See HPI

## 2022-04-07 NOTE — Patient Instructions (Addendum)
Glad you are feeling well and hope things stay that way.  I have refilled your mesalamine for the next year.  I appreciate the opportunity to care for you. Gatha Mayer, MD, [facility]

## 2022-06-17 ENCOUNTER — Encounter: Payer: Self-pay | Admitting: Emergency Medicine

## 2022-06-17 ENCOUNTER — Ambulatory Visit
Admission: EM | Admit: 2022-06-17 | Discharge: 2022-06-17 | Disposition: A | Discharge status: Home / Self Care | Payer: BC Managed Care – PPO | Attending: Urgent Care | Admitting: Urgent Care

## 2022-06-17 DIAGNOSIS — J020 Streptococcal pharyngitis: Secondary | ICD-10-CM | POA: Diagnosis not present

## 2022-06-17 MED ORDER — AMOXICILLIN 875 MG PO TABS: 875.0000 mg | ORAL_TABLET | Freq: Two times a day (BID) | ORAL | 0 refills | Status: AC

## 2022-06-17 NOTE — ED Provider Notes (Signed)
[facility]  Note:  This document was prepared using Dragon voice recognition software and may include unintentional dictation errors.  MRN: [id number] DOB: 1960/11/07  Subjective:   Beverly Sandoval is a 62 y.o. female presenting for 5 day history of acute onset persistent and worsening throat pain, painful swallowing, fever, body aches, right sinus pain, sinus issues. Takes Xyzal daily. No chest pain, shob, wheezing.   No current facility-administered medications for this encounter.  Current Outpatient Medications:    amLODipine (NORVASC) 5 MG tablet, Take 5 mg by mouth daily., Disp: , Rfl:    busPIRone (BUSPAR) 7.5 MG tablet, Take 7.5 mg by mouth 2 (two) times daily., Disp: , Rfl:    Cholecalciferol (VITAMIN D-3) 1000 UNITS CAPS, Take 3,000 Units by mouth daily., Disp: , Rfl:    Cyanocobalamin (VITAMIN B 12 PO), Take 1,500 mg by mouth daily., Disp: , Rfl:    glucose blood test strip, Dx: E11.9 - USE ONE STRIP TO CHECK GLUCOSE TWICE DAILY, Disp: , Rfl:    hydrochlorothiazide 25 MG tablet, Take 25 mg by mouth daily.  , Disp: , Rfl:    LORazepam (ATIVAN) 1 MG tablet, Take 0.5 mg by mouth at bedtime as needed., Disp: , Rfl:    MAGNESIUM PO, Take by mouth., Disp: , Rfl:    mesalamine (LIALDA) 1.2 g EC tablet, Take 2 tablets (2.4 g total) by mouth 2 (two) times daily. Rx faxed to [facility] fax # [phone], Disp: 360 tablet, Rfl: 3   metFORMIN (GLUCOPHAGE) 1000 MG tablet, Take 1,000 mg by mouth 2 (two) times daily., Disp: , Rfl:    omeprazole (PRILOSEC) 20 MG capsule, Take 20 mg by mouth daily., Disp: , Rfl:    ondansetron (ZOFRAN-ODT) 4 MG disintegrating tablet, Take 1 tablet (4 mg total) by mouth every 8 (eight) hours as needed for nausea or vomiting., Disp: 12 tablet, Rfl: 0   OZEMPIC, 0.25 OR 0.5 MG/DOSE, 2 MG/1.5ML SOPN, SMARTSIG:0.4 Milliliter(s) SUB-Q Once a Week, Disp: , Rfl:    Allergies  Allergen Reactions   Codeine     REACTION: Flu like symptoms     Past Medical History:  Diagnosis Date   Abnormal LFTs    ADD (attention deficit disorder)    Allergic rhinitis    Allergy    diabetes    Diabetes mellitus without complication (HCC)    GERD (gastroesophageal reflux disease)    Gilbert's syndrome 02/21/2011   Headache(784.0)    History of colonic polyp - adenoma 04/03/2014   03/2014 - diminutive adenoma - repeat colon 2018   HTN (hypertension)    Obesity    Plantar fasciitis    Pre-diabetes    Universal ulcerative colitis ([place])      Past Surgical History:  Procedure Laterality Date   ABLATION SAPHENOUS VEIN W/ RFA Right 2012   leg   COLONOSCOPY  01/08/2009   Normal, ulcerative colitis in remission(multiple)   COLONOSCOPY  06/15/2017   [name]   GANGLION CYST EXCISION Left 1995   hand    Family History  Problem Relation Age of Onset   Diabetes Mother        MGM   Endometrial cancer Mother    Colon polyps Mother    Prostate cancer Father    Clotting disorder Father    Stomach cancer Paternal Grandfather    Colon cancer Neg Hx    Rectal cancer Neg Hx    Esophageal cancer Neg Hx     Social History  Tobacco Use   Smoking status: Never   Smokeless tobacco: Never  Vaping Use   Vaping Use: Never used  Substance Use Topics   Alcohol use: Not Currently    Alcohol/week: 1.0 standard drink of alcohol    Types: 1 Standard drinks or equivalent per week   Drug use: No    ROS   Objective:   Vitals: BP 131/87 (BP Location: Right Arm)   Pulse (!) 117   Temp 99.6 F (37.6 C) (Oral)   Resp 18   Ht '5\' 7"'$  (1.702 m)   Wt 224 lb (101.6 kg)   LMP 02/09/2013   SpO2 99%   BMI 35.08 kg/m   Physical Exam Constitutional:      General: She is not in acute distress.    Appearance: Normal appearance. She is well-developed and normal weight. She is not ill-appearing, toxic-appearing or diaphoretic.  HENT:     Head: Normocephalic and atraumatic.     Right Ear: Tympanic membrane, ear canal and external ear normal.  No drainage or tenderness. No middle ear effusion. There is no impacted cerumen. Tympanic membrane is not erythematous or bulging.     Left Ear: Tympanic membrane, ear canal and external ear normal. No drainage or tenderness.  No middle ear effusion. There is no impacted cerumen. Tympanic membrane is not erythematous or bulging.     Nose: Nose normal. No congestion or rhinorrhea.     Mouth/Throat:     Mouth: Mucous membranes are moist. No oral lesions.     Pharynx: Pharyngeal swelling and posterior oropharyngeal erythema present. No oropharyngeal exudate or uvula swelling.     Tonsils: No tonsillar exudate or tonsillar abscesses. 1+ on the right. 1+ on the left.  Eyes:     General: No scleral icterus.       Right eye: No discharge.        Left eye: No discharge.     Extraocular Movements: Extraocular movements intact.     Right eye: Normal extraocular motion.     Left eye: Normal extraocular motion.     Conjunctiva/sclera: Conjunctivae normal.  Cardiovascular:     Rate and Rhythm: Normal rate.  Pulmonary:     Effort: Pulmonary effort is normal.  Musculoskeletal:     Cervical back: Normal range of motion and neck supple.  Lymphadenopathy:     Cervical: No cervical adenopathy.  Skin:    General: Skin is warm and dry.  Neurological:     General: No focal deficit present.     Mental Status: She is alert and oriented to person, place, and time.  Psychiatric:        Mood and Affect: Mood normal.        Behavior: Behavior normal.    + rapid strep test.  Assessment and Plan :   PDMP not reviewed this encounter.  1. Strep pharyngitis     Will treat for strep pharyngitis.  Patient is to start amoxicillin, use supportive care otherwise. Counseled patient on potential for adverse effects with medications prescribed/recommended today, ER and return-to-clinic precautions discussed, patient verbalized understanding.    Jaynee Eagles, [place] 06/17/22 [id number]

## 2022-06-17 NOTE — ED Triage Notes (Signed)
Patient c/o sore throat on and off this week, worse this morning, body aches and low grade fever.  Patient denies any OTC pain meds.  Concern for strep throat.

## 2022-07-14 ENCOUNTER — Other Ambulatory Visit: Payer: Self-pay | Admitting: Internal Medicine

## 2022-07-14 DIAGNOSIS — Z1231 Encounter for screening mammogram for malignant neoplasm of breast: Secondary | ICD-10-CM

## 2022-08-17 ENCOUNTER — Ambulatory Visit (INDEPENDENT_AMBULATORY_CARE_PROVIDER_SITE_OTHER): Payer: BC Managed Care – PPO

## 2022-08-17 DIAGNOSIS — Z1231 Encounter for screening mammogram for malignant neoplasm of breast: Secondary | ICD-10-CM | POA: Diagnosis not present

## 2023-04-27 ENCOUNTER — Telehealth: Payer: Self-pay | Admitting: Internal Medicine

## 2023-04-27 NOTE — Telephone Encounter (Signed)
I have tried to reach Beverly Sandoval back and the call will not go through. She needs an appointment and I need more information on the pharmacy she wants the rx sent to. I will have to try and reach her later.

## 2023-04-27 NOTE — Telephone Encounter (Signed)
PT needs a refill for mesalamine sent to [facility]. Please advise. She only has one month left

## 2023-04-27 NOTE — Telephone Encounter (Signed)
Sharada's phone will not let me leave a message so I called her husband Onalee Hua and left him a detailed voice mail. Hopefully she will call me tomorrow and set up an appointment and clarify the pharmacy for me.

## 2023-04-28 NOTE — Telephone Encounter (Signed)
Patient called and stated that she was return PJ call. Patient stated she did have a new phone number and she wish to be called at [phone]. Please advise

## 2023-04-28 NOTE — Telephone Encounter (Signed)
I tried to reach her and her voice mail is not set up.

## 2023-05-01 NOTE — Telephone Encounter (Signed)
I spoke with Keairra and confirmed her pharmacy [facility] is in [place]. I told her our fax has been out so they probably did fax it and we haven't gotten it. She will call them at [phone] and give them our other fax # [phone]. Her husband has prostate cancer surgery the end of December and then treatments. She is going to call back once she finds out his treatment times as she will be taking him and she will set up an appointment with Dr Leone Payor.

## 2023-05-01 NOTE — Telephone Encounter (Signed)
Inbound call from patient returning phone call. Requesting a call back. Please advise, thank you.  

## 2023-05-02 NOTE — Telephone Encounter (Signed)
I left a detailed message for Beverly Sandoval that our fax machine has been fixed . I left her the # [phone] fax #. I will try to reach her tomorrow.

## 2023-05-02 NOTE — Telephone Encounter (Signed)
Patient requesting to speak in regards to previous note. Please advise.

## 2023-05-03 MED ORDER — MESALAMINE 1.2 G PO TBEC: 2.4000 g | DELAYED_RELEASE_TABLET | Freq: Two times a day (BID) | ORAL | 0 refills | Status: DC

## 2023-05-03 NOTE — Telephone Encounter (Signed)
I spoke to Shaakira and I have e-scribed her mesalamine so she won't run out before her appointment which we will set up for March. We are awaiting the schedule to come out.

## 2023-05-08 NOTE — Telephone Encounter (Signed)
She called in and set up a Jan. 13th appointment that came open.

## 2023-05-08 NOTE — Telephone Encounter (Signed)
I left her a detailed message to call us back and set up a March appointment, they are open now.

## 2023-06-05 ENCOUNTER — Encounter: Payer: Self-pay | Admitting: Internal Medicine

## 2023-06-05 ENCOUNTER — Ambulatory Visit: Payer: BC Managed Care – PPO | Admitting: Internal Medicine

## 2023-06-05 VITALS — BP 110/76 | HR 68 | Ht 67.0 in | Wt 216.0 lb

## 2023-06-05 DIAGNOSIS — Z860101 Personal history of adenomatous and serrated colon polyps: Secondary | ICD-10-CM

## 2023-06-05 DIAGNOSIS — Z860100 Personal history of colon polyps, unspecified: Secondary | ICD-10-CM

## 2023-06-05 DIAGNOSIS — K760 Fatty (change of) liver, not elsewhere classified: Secondary | ICD-10-CM | POA: Diagnosis not present

## 2023-06-05 DIAGNOSIS — K8020 Calculus of gallbladder without cholecystitis without obstruction: Secondary | ICD-10-CM | POA: Diagnosis not present

## 2023-06-05 DIAGNOSIS — R748 Abnormal levels of other serum enzymes: Secondary | ICD-10-CM | POA: Diagnosis not present

## 2023-06-05 DIAGNOSIS — K5100 Ulcerative (chronic) pancolitis without complications: Secondary | ICD-10-CM

## 2023-06-05 DIAGNOSIS — K5190 Ulcerative colitis, unspecified, without complications: Secondary | ICD-10-CM | POA: Diagnosis not present

## 2023-06-05 MED ORDER — MESALAMINE 1.2 G PO TBEC: 2.4000 g | DELAYED_RELEASE_TABLET | Freq: Two times a day (BID) | ORAL | 3 refills | Status: AC

## 2023-06-05 NOTE — Patient Instructions (Addendum)
 _______________________________________________________  If your blood pressure at your visit was 140/90 or greater, please contact your primary care physician to follow up on this.  _______________________________________________________  If you are age 63 or older, your body mass index should be between 23-30. Your Body mass index is 33.83 kg/m. If this is out of the aforementioned range listed, please consider follow up with your Primary Care Provider.  If you are age 75 or younger, your body mass index should be between 19-25. Your Body mass index is 33.83 kg/m. If this is out of the aformentioned range listed, please consider follow up with your Primary Care Provider.   ________________________________________________________  The [facility] providers would like to encourage you to use MYCHART to communicate with providers for non-urgent requests or questions.  Due to long hold times on the telephone, sending your provider a message by [facility] may be a faster and more efficient way to get a response.  Please allow 48 business hours for a response.  Please remember that this is for non-urgent requests.  _______________________________________________________  Rosine are in our system for a colonoscopy recall for 12/2023.  We have sent in refills for your Lialda  to [place] as requested.  I appreciate the opportunity to care for you. Lupita Commander, MD, [facility]

## 2023-06-05 NOTE — Progress Notes (Signed)
 Beverly Sandoval 63 y.o. Oct 14, 1960 [id number]  Assessment & Plan:   Encounter Diagnoses  Name Primary?   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  She has had some mild elevation of alkaline phosphatase off and on over the last year or so from chart review.  It is not progressively rising so would continue to monitor that.  It could be from NAFLD.  If it has a rising trajectory we would need to consider an MRCP to look for primary sclerosing cholangitis.  I do not think we need to do that at this time.  I did review this with the patient.  She will have a colonoscopy approximately August 2025 for surveillance of her ulcerative colitis and history of adenomatous colon polyps.  I think the gallstones are likely asymptomatic.  The indication was nausea and vomiting that was transient without pain, if she does develop signs or symptoms of biliary colic would need to consider cholecystectomy.   Meds ordered this encounter  Medications   mesalamine  (LIALDA ) 1.2 g EC tablet    Sig: Take 2 tablets (2.4 g total) by mouth 2 (two) times daily. Rx faxed to [facility] fax # [phone]    Dispense:  360 tablet    Refill:  3    CC: Beverly Asberry NOVAK, DO   Subjective:   GI summary:   Universal ulcerative colitis diagnosed 2005.  Has been maintained on different mesalamine  therapies over the years currently on Lialda  generic 2.4 g twice daily.  Last colonoscopy endoscopic remission 01/09/2021, focal active colitis and cecal/ascending biopsies.   History of adenomatous colon polyps-diminutive adenoma 2015 and diminutive adenoma 2022  NAFLD fluctuating transaminases at times, fatty liver seen on ultrasound  imaging  ---------------------------------------------------------------------------------------------------- Chief Complaint: Follow-up of ulcerative colitis  HPI The patient is here for follow-up with the above GI history, she reports she is doing well with only rare transient mild diarrhea.  She is on Ozempic for over a year and that is working well with her diabetes and she has been able to come off metformin.  Weight has dropped off but that has slowed.  She does not seem to be having problems with that.  In the summer she had some nausea vomiting and had an ultrasound performed with results as below:  US  Abd Complete .  IVC: Visualized portions are patent. .  Aorta: Atherosclerotic plaque throughout the aorta. No visualized abdominal aortic aneurysm. Beverly Sandoval  Pancreas: Visualized portions are unremarkable. .  Liver: Length = 17.0 cm. Normal contour, echogenicity, and echotexture. No focal lesions. Visualized portions of the portal and hepatic venous systems are patent with antegrade flow. .  Gallbladder: Echogenic focus with posterior shadowing within the gallbladder measuring 8 mm. No gallbladder sludge. No gallbladder wall thickening or pericholecystic fluid. Negative sonographic Murphy's sign. .  Biliary: CBD maximum caliber = 3 mm. No intrahepatic ductal dilatation. .  Right Kidney: Length = 10.1 cm. Normal contour and echogenicity. No hydronephrosis or perinephric fluid. No focal mass is identified. .  Left Kidney: Length = 10.7 cm. Normal contour and echogenicity. No hydronephrosis or perinephric fluid. No focal mass is identified. Beverly Sandoval  Spleen: Length =  13.7 cm. Enlarged. Normal contour and echotexture. No focal lesions. .  Bladder:  Unremarkable for degree of distention. .  Peritoneum: No ascites.  IMPRESSION: 1.  Cholelithiasis without evidence of acute cholecystitis. 2.  Splenomegaly. Exam End: 12/07/22 08:39   03/08/2023 LFTs with alkaline phosphatase 115 normal transaminases and  bilirubin.  Top normal alk phos 104  10/21/2022 alk phos 93  June 17, 2022 alk phos 116  October 2023 alk phos 104   Normal CBC March 08, 2023 Wt Readings from Last 3 Encounters:  06/05/23 216 lb (98 kg)  06/17/22 224 lb (101.6 kg)  04/07/22 228 lb (103.4 kg)     Allergies  Allergen Reactions   Codeine     REACTION: Flu like symptoms   Current Meds  Medication Sig   amLODipine (NORVASC) 5 MG tablet Take 5 mg by mouth daily.   busPIRone (BUSPAR) 7.5 MG tablet Take 7.5 mg by mouth 2 (two) times daily.   Cholecalciferol (VITAMIN D-3) 1000 UNITS CAPS Take 3,000 Units by mouth daily.   Cyanocobalamin (VITAMIN B 12 PO) Take 1,500 mg by mouth daily.   glucose blood test strip Dx: E11.9 - USE ONE STRIP TO CHECK GLUCOSE TWICE DAILY   hydrochlorothiazide 25 MG tablet Take 25 mg by mouth daily.     LORazepam (ATIVAN) 1 MG tablet Take 0.5 mg by mouth at bedtime as needed.   losartan-hydrochlorothiazide (HYZAAR) 50-12.5 MG tablet Take 1 tablet by mouth daily.   MAGNESIUM PO Take by mouth.   omeprazole (PRILOSEC) 20 MG capsule Take 20 mg by mouth daily.   ondansetron  (ZOFRAN -ODT) 4 MG disintegrating tablet Take 1 tablet (4 mg total) by mouth every 8 (eight) hours as needed for nausea or vomiting.  Mesalamine  1.2 g tabs 2.4 g twice daily (obtains from [place])  Past Medical History:  Diagnosis Date   Abnormal LFTs    ADD (attention deficit disorder)    Allergic rhinitis    Allergy    diabetes    Diabetes mellitus without complication (HCC)    GERD (gastroesophageal reflux disease)    Gilbert's syndrome 02/21/2011   Headache(784.0)    History of colonic polyp - adenoma 04/03/2014   03/2014 - diminutive adenoma - repeat colon 2018   HTN (hypertension)    Obesity    Plantar fasciitis    Pre-diabetes    Universal ulcerative colitis (HCC)    Past Surgical History:  Procedure Laterality Date   ABLATION SAPHENOUS VEIN W/ RFA Right 2012   leg   COLONOSCOPY  01/08/2009    Normal, ulcerative colitis in remission(multiple)   COLONOSCOPY  06/15/2017   Beverly Sandoval   GANGLION CYST EXCISION Left 1995   hand   Social History   Social History Narrative   Married 2 children   Works in [employer]   Non-smoker rare alcohol no drug use   family history includes Clotting disorder in her father; Colon polyps in her mother; Diabetes in her mother; Endometrial cancer in her mother; Prostate cancer in her father; Stomach cancer in her paternal grandfather.   Review of Systems As per HPI  Objective:   Physical Exam BP 110/76   Pulse 68   Ht 5' 7 (1.702 m)   Wt 216 lb (98 kg)   LMP 02/09/2013   BMI 33.83 kg/m  No acute distress Obese white woman Lungs clear Heart sounds are normal S1-S2 no rubs murmurs or gallops The abdomen is soft and nontender without masses bowel sounds present

## 2023-07-19 ENCOUNTER — Other Ambulatory Visit: Payer: Self-pay | Admitting: Internal Medicine

## 2023-07-19 DIAGNOSIS — Z780 Asymptomatic menopausal state: Secondary | ICD-10-CM

## 2023-09-13 ENCOUNTER — Ambulatory Visit: Payer: BC Managed Care – PPO

## 2023-09-13 DIAGNOSIS — Z780 Asymptomatic menopausal state: Secondary | ICD-10-CM

## 2023-09-13 DIAGNOSIS — Z1231 Encounter for screening mammogram for malignant neoplasm of breast: Secondary | ICD-10-CM

## 2023-09-28 ENCOUNTER — Telehealth: Payer: Self-pay | Admitting: Internal Medicine

## 2023-09-28 NOTE — Telephone Encounter (Signed)
 OK to use ibuprofen x 2 days

## 2023-09-28 NOTE — Telephone Encounter (Signed)
 Spoke with patient, she reports that she is having oral surgery today for removal of infection in her gum. She states she was advised that she can take Ibuprofen 800 mg for the next 2 days. She is wanting to know if this is ok for her to take and how much Dr. Willy Harvest recommends. She states she has Tramadol  at home that she can take instead, however it doesn't help as much with the pain. Please advise.

## 2023-09-28 NOTE — Telephone Encounter (Signed)
 Patient requesting a call back to be advised on taking Ibuprofen 800 mg. States she had a dental procedure and was advised that it would  help best with pain. Please advise, thank you.

## 2023-09-28 NOTE — Telephone Encounter (Signed)
 Called patient and relayed information from provider. Patient verbalized understanding and agrees with plan of care.

## 2024-02-06 ENCOUNTER — Encounter: Payer: Self-pay | Admitting: Internal Medicine

## 2024-02-16 ENCOUNTER — Encounter: Payer: Self-pay | Admitting: Internal Medicine

## 2024-03-13 ENCOUNTER — Ambulatory Visit

## 2024-03-13 VITALS — Ht 67.0 in | Wt 202.0 lb

## 2024-03-13 DIAGNOSIS — K5190 Ulcerative colitis, unspecified, without complications: Secondary | ICD-10-CM

## 2024-03-13 DIAGNOSIS — Z860100 Personal history of colon polyps, unspecified: Secondary | ICD-10-CM

## 2024-03-13 MED ORDER — NA SULFATE-K SULFATE-MG SULF 17.5-3.13-1.6 GM/177ML PO SOLN: 1.0000 | Freq: Once | ORAL | 0 refills | Status: AC

## 2024-03-13 NOTE — Progress Notes (Signed)
 No issues known to pt with past sedation with any surgeries or procedures Patient denies ever being told they had issues or difficulty with intubation  No FH of Malignant Hyperthermia Pt is not on diet pills; does take GLP-1 medication Pt is not on  home 02  Pt is not on blood thinners  Pt denies issues with chronic constipation  No A fib or A flutter Have any cardiac testing pending--no Pt instructed to use Singlecare.[email or web address] or GoodRx for a price reduction on prep  Ambulates independently Patient states she is a difficult stick

## 2024-03-14 ENCOUNTER — Telehealth: Payer: Self-pay | Admitting: Internal Medicine

## 2024-03-14 NOTE — Telephone Encounter (Signed)
 Inbound call from patient stating that she would like to speak to the pre-visit nurse in regards to her prep medication and was wondering if she is possible able to add any sugar to her prep to make it sweet. Patient is schedule to have her procedure on November the 5 th. Patient is requesting a call back. Please advise.

## 2024-03-14 NOTE — Telephone Encounter (Signed)
 Pt wondering if  she can use sweetener with the list of clear liquids. Informed pt that she can use her choice of sweetener for anything on the clear liquid list reviewed with pt. Pt states she understands and has no other questions at thist time.

## 2024-03-21 ENCOUNTER — Telehealth: Payer: Self-pay | Admitting: Internal Medicine

## 2024-03-21 ENCOUNTER — Encounter: Payer: Self-pay | Admitting: Internal Medicine

## 2024-03-21 NOTE — Telephone Encounter (Signed)
 Patient needing to be further advised on pre certification.

## 2024-03-27 ENCOUNTER — Encounter: Payer: Self-pay | Admitting: Internal Medicine

## 2024-03-27 ENCOUNTER — Ambulatory Visit (AMBULATORY_SURGERY_CENTER): Admitting: Internal Medicine

## 2024-03-27 VITALS — BP 105/43 | HR 76 | Temp 98.2°F | Resp 19 | Ht 67.0 in | Wt 202.0 lb

## 2024-03-27 DIAGNOSIS — Z860100 Personal history of colon polyps, unspecified: Secondary | ICD-10-CM

## 2024-03-27 DIAGNOSIS — K644 Residual hemorrhoidal skin tags: Secondary | ICD-10-CM | POA: Diagnosis not present

## 2024-03-27 DIAGNOSIS — Z860101 Personal history of adenomatous and serrated colon polyps: Secondary | ICD-10-CM | POA: Diagnosis not present

## 2024-03-27 DIAGNOSIS — K648 Other hemorrhoids: Secondary | ICD-10-CM | POA: Diagnosis not present

## 2024-03-27 DIAGNOSIS — K5730 Diverticulosis of large intestine without perforation or abscess without bleeding: Secondary | ICD-10-CM

## 2024-03-27 DIAGNOSIS — K5100 Ulcerative (chronic) pancolitis without complications: Secondary | ICD-10-CM | POA: Diagnosis not present

## 2024-03-27 DIAGNOSIS — Z1211 Encounter for screening for malignant neoplasm of colon: Secondary | ICD-10-CM

## 2024-03-27 DIAGNOSIS — K5190 Ulcerative colitis, unspecified, without complications: Secondary | ICD-10-CM | POA: Diagnosis not present

## 2024-03-27 MED ORDER — SODIUM CHLORIDE 0.9 % IV SOLN: 500.0000 mL | Freq: Once | INTRAVENOUS | Status: DC

## 2024-03-27 NOTE — Progress Notes (Signed)
 Report given to PACU, vss

## 2024-03-27 NOTE — Op Note (Signed)
 [facility] Patient Name: Beverly Sandoval Procedure Date: 03/27/2024 9:36 AM MRN: [id number] Endoscopist: Lupita FORBES Commander , MD, [id number] Age: 63 Referring MD:  Date of Birth: 01/02/1961 Gender: Female Account #: [id number] Procedure:                Colonoscopy Indications:              High risk colon cancer surveillance: Ulcerative                            pancolitis of 8 (or more) years duration also has                            hx colon adenomas Medicines:                Monitored Anesthesia Care Procedure:                Pre-Anesthesia Assessment:                           - Prior to the procedure, a History and Physical                            was performed, and patient medications and                            allergies were reviewed. The patient's tolerance of                            previous anesthesia was also reviewed. The risks                            and benefits of the procedure and the sedation                            options and risks were discussed with the patient.                            All questions were answered, and informed consent                            was obtained. Prior Anticoagulants: The patient has                            taken no anticoagulant or antiplatelet agents. ASA                            Grade Assessment: II - A patient with mild systemic                            disease. After reviewing the risks and benefits,                            the patient was deemed in satisfactory condition to  undergo the procedure.                           After obtaining informed consent, the colonoscope                            was passed under direct vision. Throughout the                            procedure, the patient's blood pressure, pulse, and                            oxygen saturations were monitored continuously. The                            Colonoscope was introduced through the  anus and                            advanced to the the terminal ileum, with                            identification of the appendiceal orifice and IC                            valve. The colonoscopy was performed without                            difficulty. The patient tolerated the procedure                            well. The quality of the bowel preparation was                            good. The terminal ileum, ileocecal valve,                            appendiceal orifice, and rectum were photographed.                            The bowel preparation used was SUPREP via split                            dose instruction. Scope In: 9:49:54 AM Scope Out: 10:02:00 AM Scope Withdrawal Time: 0 hours 9 minutes 50 seconds  Total Procedure Duration: 0 hours 12 minutes 6 seconds  Findings:                 The perianal and digital rectal examinations were                            normal.                           Multiple small-mouthed diverticula were found in  the sigmoid colon.                           The terminal ileum appeared normal.                           The exam was otherwise without abnormality on                            direct and retroflexion views.                           External and internal hemorrhoids were found.                           Two biopsies were taken every 10 cm with a cold                            forceps from the entire colon for ulcerative                            colitis surveillance. These biopsy specimens were                            sent to Pathology. Estimated blood loss was minimal. Complications:            No immediate complications. Estimated Blood Loss:     Estimated blood loss was minimal. Impression:               - Diverticulosis in the sigmoid colon.                           - The examined portion of the ileum was normal.                           - The examination was otherwise normal on  direct                            and retroflexion views.                           - Biopsies for surveillance were taken from the                            entire colon.                           - Personal history of colonic polyps. Recommendation:           - Patient has a contact number available for                            emergencies. The signs and symptoms of potential                            delayed complications were discussed with the  patient. Return to normal activities tomorrow.                            Written discharge instructions were provided to the                            patient.                           - Resume previous diet.                           - Continue present medications.                           - Repeat colonoscopy is recommended for                            surveillance. The colonoscopy date will be                            determined after pathology results from today's                            exam become available for review. Lupita FORBES Commander, MD 03/27/2024 10:18:22 AM This report has been signed electronically.

## 2024-03-27 NOTE — Progress Notes (Signed)
 Called to room to assist during endoscopic procedure.  Patient ID and intended procedure confirmed with present staff. Received instructions for my participation in the procedure from the performing physician.

## 2024-03-27 NOTE — Patient Instructions (Addendum)
 No evidence of active colitis today.  No polyps, either.  You do have some diverticulosis as you have had before.  Hemorrhoids were somewhat swollen which is common after a colonoscopy prep.  I took biopsies as we typically do.  I will let you know pathology results and when to have another routine colonoscopy by mail and/or My Chart.  I will look into the cost of testing to assess for fibrosis of fatty liver and let you know. It will be fine to do this in early 2026.  I appreciate the opportunity to care for you. Lupita CHARLENA Commander, MD, [facility]   Discharge instructions given. Handout on Diverticulosis. Biopsies taken. Resume previous medications. YOU HAD AN ENDOSCOPIC PROCEDURE TODAY AT THE [facility]:   Refer to the procedure report that was given to you for any specific questions about what was found during the examination.  If the procedure report does not answer your questions, please call your gastroenterologist to clarify.  If you requested that your care partner not be given the details of your procedure findings, then the procedure report has been included in a sealed envelope for you to review at your convenience later.  YOU SHOULD EXPECT: Some feelings of bloating in the abdomen. Passage of more gas than usual.  Walking can help get rid of the air that was put into your GI tract during the procedure and reduce the bloating. If you had a lower endoscopy (such as a colonoscopy or flexible sigmoidoscopy) you may notice spotting of blood in your stool or on the toilet paper. If you underwent a bowel prep for your procedure, you may not have a normal bowel movement for a few days.  Please Note:  You might notice some irritation and congestion in your nose or some drainage.  This is from the oxygen used during your procedure.  There is no need for concern and it should clear up in a day or so.  SYMPTOMS TO REPORT IMMEDIATELY:  Following lower endoscopy (colonoscopy or flexible  sigmoidoscopy):  Excessive amounts of blood in the stool  Significant tenderness or worsening of abdominal pains  Swelling of the abdomen that is new, acute  Fever of 100F or higher   For urgent or emergent issues, a gastroenterologist can be reached at any hour by calling [phone]. Do not use MyChart messaging for urgent concerns.    DIET:  We do recommend a small meal at first, but then you may proceed to your regular diet.  Drink plenty of fluids but you should avoid alcoholic beverages for 24 hours.  ACTIVITY:  You should plan to take it easy for the rest of today and you should NOT DRIVE or use heavy machinery until tomorrow (because of the sedation medicines used during the test).    FOLLOW UP: Our staff will call the number listed on your records the next business day following your procedure.  We will call around 7:15- 8:00 am to check on you and address any questions or concerns that you may have regarding the information given to you following your procedure. If we do not reach you, we will leave a message.     If any biopsies were taken you will be contacted by phone or by letter within the next 1-3 weeks.  Please call us  at [phone] if you have not heard about the biopsies in 3 weeks.    SIGNATURES/CONFIDENTIALITY: You and/or your care partner have signed paperwork which will be entered  into your electronic medical record.  These signatures attest to the fact that that the information above on your After Visit Summary has been reviewed and is understood.  Full responsibility of the confidentiality of this discharge information lies with you and/or your care-partner.

## 2024-03-27 NOTE — Progress Notes (Signed)
 [facility] History and Physical   Primary Care Physician:  Juliene Asberry NOVAK, DO   Reason for Procedure:    Encounter Diagnoses  Name Primary?   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  Has been maintained on different mesalamine  therapies over the years currently on Lialda  generic 2.4 g twice daily.  Last colonoscopy endoscopic remission 01/09/2021, focal active colitis and cecal/ascending biopsies.   History of adenomatous colon polyps-diminutive adenoma 2015 and diminutive adenoma 2022   NAFLD fluctuating transaminases at times, fatty liver seen on ultrasound imaging   HPI: Beverly Sandoval is a 63 y.o. female presenting for surveillance colonoscopy in the setting of universal ulcerative colitis and prior colon polyps. Regarding fatty liver fib 4 score is 1.53 based upon September labs.  Further investigation suggested.  Wt Readings from Last 3 Encounters:  03/27/24 202 lb (91.6 kg)  03/13/24 202 lb (91.6 kg)  06/05/23 216 lb (98 kg)     Past Medical History:  Diagnosis Date   Abnormal LFTs    ADD (attention deficit disorder)    Allergic rhinitis    Allergy    Anxiety    Arthritis    Depression    diabetes    Diabetes mellitus without complication (HCC)    GERD (gastroesophageal reflux disease)    Gilbert's syndrome 02/21/2011   Headache(784.0)    History of colonic polyp - adenoma 04/03/2014   03/2014 - diminutive adenoma - repeat colon 2018   HTN (hypertension)    Obesity    Plantar fasciitis    Pre-diabetes    Universal ulcerative colitis (HCC)     Past Surgical History:  Procedure Laterality Date   ABLATION SAPHENOUS VEIN W/ RFA Right 2012   leg   COLONOSCOPY  01/08/2009   Normal, ulcerative colitis in remission(multiple)   COLONOSCOPY  06/15/2017   [facility]   GANGLION CYST EXCISION Left 1995    hand     Current Outpatient Medications  Medication Sig Dispense Refill   busPIRone (BUSPAR) 7.5 MG tablet Take 7.5 mg by mouth 2 (two) times daily.     Cholecalciferol (VITAMIN D-3) 1000 UNITS CAPS Take 3,000 Units by mouth daily.     Cyanocobalamin (VITAMIN B 12 PO) Take 1,500 mg by mouth daily.     glucose blood test strip Dx: E11.9 - USE ONE STRIP TO CHECK GLUCOSE TWICE DAILY     losartan-hydrochlorothiazide (HYZAAR) 50-12.5 MG tablet Take 1 tablet by mouth daily.     MAGNESIUM PO Take by mouth.     mesalamine  (LIALDA ) 1.2 g EC tablet Take 2 tablets (2.4 g total) by mouth 2 (two) times daily. Rx faxed to [facility] fax # [phone] 360 tablet 3   Multiple Vitamins-Minerals (DAILY MULTIVITAMIN) CAPS Take 1 capsule by mouth daily.     omeprazole (PRILOSEC) 20 MG capsule Take 20 mg by mouth daily.     amLODipine (NORVASC) 5 MG tablet Take 5 mg by mouth daily. (Patient not taking: Reported on 03/27/2024)     amoxicillin  (AMOXIL ) 875 MG tablet Take 1 tablet (875 mg total) by mouth 2 (two) times daily. (Patient not taking: Reported on 03/27/2024) 20 tablet 0   LORazepam (ATIVAN) 1 MG tablet Take 0.5 mg by mouth at bedtime as needed.     MOUNJARO 12.5 MG/0.5ML Pen Inject 12.5 mg into the skin  once a week.     ondansetron  (ZOFRAN -ODT) 4 MG disintegrating tablet Take 1 tablet (4 mg total) by mouth every 8 (eight) hours as needed for nausea or vomiting. 12 tablet 0   Current Facility-Administered Medications  Medication Dose Route Frequency Provider Last Rate Last Admin   0.9 %  sodium chloride  infusion  500 mL Intravenous Once [facility] Lupita BRAVO, MD        Allergies as of 03/27/2024 - Review Complete 03/27/2024  Allergen Reaction Noted   Codeine Other (See Comments) and Nausea Only 10/24/2007    Family History  Problem Relation Age of Onset   Diabetes Mother        MGM   Endometrial cancer Mother    Colon polyps Mother    Prostate cancer Father    Clotting disorder Father     Stomach cancer Paternal Grandfather    Colon cancer Neg Hx    Rectal cancer Neg Hx    Esophageal cancer Neg Hx     Social History   Socioeconomic History   Marital status: Married    Spouse name: Not on file   Number of children: 2   Years of education: Not on file   Highest education level: Not on file  Occupational History   Occupation: [employer]  Tobacco Use   Smoking status: Never   Smokeless tobacco: Never  Vaping Use   Vaping status: Never Used  Substance and Sexual Activity   Alcohol use: Not Currently   Drug use: No   Sexual activity: Not on file  Other Topics Concern   Not on file  Social History Narrative   Married 2 children   Works in [employer]   Non-smoker rare alcohol no drug use   Social Drivers of Corporate Investment Banker Strain: Not on file  Food Insecurity: Unknown (03/13/2024)   Received from [facility]   Hunger Vital Sign    Within the past 12 months, you worried that your food would run out before you got money to buy more: Patient declined to answer    Within the past 12 months, the food you bought just didn't last and you didn't have money to get more. : Patient declined to answer  Transportation Needs: Not on file (03/13/2024)  Physical Activity: Not on file  Stress: Not on file  Social Connections: Not on file  Intimate Partner Violence: Not on file    Review of Systems:  All other review of systems negative except as mentioned in the HPI.  Physical Exam: Vital signs BP 118/74   Pulse 79   Temp 98.2 F (36.8 C) (Temporal)   Resp 12   Ht 5' 7 (1.702 m)   Wt 202 lb (91.6 kg)   LMP 02/09/2013   SpO2 100%   BMI 31.64 kg/m   General:   Alert,  Well-developed, well-nourished, pleasant and cooperative in NAD Lungs:  Clear throughout to auscultation.   Heart:  Regular rate and rhythm; no murmurs, clicks, rubs,  or gallops. Abdomen:  Soft, nontender and nondistended. Normal bowel sounds.   Neuro/Psych:   Alert and cooperative. Normal mood and affect. A and O x 3   @Savannha Welle  CHARLENA [facility], MD, [facility] [phone] (pager) 03/27/2024 9:40 AM@

## 2024-03-28 ENCOUNTER — Telehealth: Payer: Self-pay

## 2024-03-28 NOTE — Telephone Encounter (Signed)
  Follow up Call-     03/27/2024    8:41 AM  Call back number  Post procedure Call Back phone  # [phone]  Permission to leave phone message Yes     Patient questions:  Do you have a fever, pain , or abdominal swelling? No. Pain Score  0 *  Have you tolerated food without any problems? Yes.    Have you been able to return to your normal activities? Yes.    Do you have any questions about your discharge instructions: Diet   No. Medications  No. Follow up visit  No.  Do you have questions or concerns about your Care? No.  Actions: * If pain score is 4 or above: No action needed, pain <4.

## 2024-03-29 LAB — SURGICAL PATHOLOGY

## 2024-04-09 ENCOUNTER — Ambulatory Visit: Payer: Self-pay | Admitting: Internal Medicine
